# Patient Record
Sex: Male | Born: 1975 | Race: White | Hispanic: No | Smoking: Former smoker
Health system: Southern US, Community
[De-identification: ages and names within clinical notes are randomized; demographics above are authoritative.]

## PROBLEM LIST (undated history)

## (undated) DIAGNOSIS — K219 Gastro-esophageal reflux disease without esophagitis: Secondary | ICD-10-CM

## (undated) DIAGNOSIS — M1990 Unspecified osteoarthritis, unspecified site: Secondary | ICD-10-CM

## (undated) HISTORY — PX: KNEE SURGERY: SHX244

## (undated) HISTORY — PX: MYRINGOTOMY WITH TUBE PLACEMENT: SHX5663

---

## 2001-02-05 ENCOUNTER — Emergency Department (HOSPITAL_COMMUNITY): Admission: EM | Admit: 2001-02-05 | Discharge: 2001-02-05 | Payer: Self-pay | Admitting: Emergency Medicine

## 2003-01-19 ENCOUNTER — Emergency Department (HOSPITAL_COMMUNITY): Admission: EM | Admit: 2003-01-19 | Discharge: 2003-01-19 | Payer: Self-pay | Admitting: Internal Medicine

## 2003-01-19 ENCOUNTER — Encounter: Payer: Self-pay | Admitting: Internal Medicine

## 2010-12-31 ENCOUNTER — Ambulatory Visit: Payer: Self-pay | Admitting: Family Medicine

## 2011-01-01 ENCOUNTER — Ambulatory Visit: Payer: Self-pay | Admitting: Family Medicine

## 2014-07-21 ENCOUNTER — Other Ambulatory Visit (HOSPITAL_COMMUNITY): Payer: Self-pay | Admitting: Sports Medicine

## 2014-07-21 DIAGNOSIS — M25562 Pain in left knee: Secondary | ICD-10-CM

## 2014-07-29 ENCOUNTER — Ambulatory Visit (HOSPITAL_COMMUNITY)
Admission: RE | Admit: 2014-07-29 | Discharge: 2014-07-29 | Disposition: A | Discharge status: Home / Self Care | Payer: 59 | Source: Ambulatory Visit | Attending: Sports Medicine | Admitting: Sports Medicine

## 2014-07-29 DIAGNOSIS — R609 Edema, unspecified: Secondary | ICD-10-CM | POA: Diagnosis not present

## 2014-07-29 DIAGNOSIS — M2242 Chondromalacia patellae, left knee: Secondary | ICD-10-CM | POA: Insufficient documentation

## 2014-07-29 DIAGNOSIS — M25562 Pain in left knee: Secondary | ICD-10-CM | POA: Insufficient documentation

## 2014-08-02 ENCOUNTER — Ambulatory Visit: Payer: Self-pay | Admitting: Orthopedic Surgery

## 2014-11-21 ENCOUNTER — Encounter (HOSPITAL_COMMUNITY): Payer: Self-pay | Admitting: *Deleted

## 2014-11-21 ENCOUNTER — Emergency Department (HOSPITAL_COMMUNITY)
Admission: EM | Admit: 2014-11-21 | Discharge: 2014-11-21 | Disposition: A | Discharge status: Home / Self Care | Payer: 59 | Attending: Emergency Medicine | Admitting: Emergency Medicine

## 2014-11-21 ENCOUNTER — Emergency Department (HOSPITAL_COMMUNITY): Payer: 59

## 2014-11-21 DIAGNOSIS — Y9364 Activity, baseball: Secondary | ICD-10-CM | POA: Diagnosis not present

## 2014-11-21 DIAGNOSIS — X58XXXA Exposure to other specified factors, initial encounter: Secondary | ICD-10-CM | POA: Insufficient documentation

## 2014-11-21 DIAGNOSIS — S76312A Strain of muscle, fascia and tendon of the posterior muscle group at thigh level, left thigh, initial encounter: Secondary | ICD-10-CM | POA: Insufficient documentation

## 2014-11-21 DIAGNOSIS — Z87891 Personal history of nicotine dependence: Secondary | ICD-10-CM | POA: Insufficient documentation

## 2014-11-21 DIAGNOSIS — Y92838 Other recreation area as the place of occurrence of the external cause: Secondary | ICD-10-CM | POA: Insufficient documentation

## 2014-11-21 DIAGNOSIS — S76302A Unspecified injury of muscle, fascia and tendon of the posterior muscle group at thigh level, left thigh, initial encounter: Secondary | ICD-10-CM

## 2014-11-21 DIAGNOSIS — S8992XA Unspecified injury of left lower leg, initial encounter: Secondary | ICD-10-CM | POA: Diagnosis present

## 2014-11-21 DIAGNOSIS — Y998 Other external cause status: Secondary | ICD-10-CM | POA: Insufficient documentation

## 2014-11-21 MED ORDER — IBUPROFEN 800 MG PO TABS
800.0000 mg | ORAL_TABLET | Freq: Once | ORAL | Status: AC
  Administered 2014-11-21: 800 mg via ORAL
  Filled 2014-11-21: qty 1

## 2014-11-21 MED ORDER — OXYCODONE-ACETAMINOPHEN 5-325 MG PO TABS: 1.0000 | ORAL_TABLET | ORAL | Status: DC | PRN

## 2014-11-21 MED ORDER — NAPROXEN 500 MG PO TABS: 500.0000 mg | ORAL_TABLET | Freq: Two times a day (BID) | ORAL | Status: DC

## 2014-11-21 NOTE — ED Notes (Signed)
Given a pre pack of percocet, 1 q4 h prn

## 2014-11-21 NOTE — Discharge Instructions (Signed)
Tendon Injury Tendons are strong, cordlike structures that connect muscle to bone. Tendons are made up of woven fibers, like a rope. A tendon injury is a tear (rupture) of the tendon. The rupture may be partial (only a few of the fibers in your tendon rupture) or complete (your entire tendon ruptures). CAUSES  Tendon injuries can be caused by high-stress activities, such as sports. They also can be caused by a repetitive injury or by a single injury from an excessive, rapid force. SYMPTOMS  Symptoms of tendon injury include pain when you move the joint close to the tendon. Other symptoms are swelling, redness, and warmth. DIAGNOSIS  Tendon injuries often can be diagnosed by physical exam. However, sometimes an X-ray exam or advanced imaging, such as magnetic resonance imaging (MRI), is necessary to determine the extent of the injury. TREATMENT  Partial tendon ruptures often can be treated with immobilization. A splint, bandage, or removable brace usually is used to immobilize the injured tendon. Most injured tendons need to be immobilized for 1-2 months before they are completely healed. Complete tendon ruptures may require surgical reattachment. Document Released: 08/08/2004 Document Revised: 06/20/2011 Document Reviewed: 09/22/2011 [facility] Patient Information 2015 [place], [place]. This information is not intended to replace advice given to you by your health care provider. Make sure you discuss any questions you have with your health care provider.

## 2014-11-21 NOTE — ED Notes (Signed)
Pain lt knee, injury today when playing baseball, jumped up from crouching position and had pain in lt knee.

## 2014-11-21 NOTE — ED Notes (Signed)
Pt c/o left knee pain after catching a ball today at his son's baseball game; pt has had previous injury to knee

## 2014-11-23 NOTE — ED Provider Notes (Signed)
CSN: [id number]     Arrival date & time 11/21/14  2048 History   First MD Initiated Contact with Patient 11/21/14 2145     Chief Complaint  Patient presents with  . Knee Pain     (Consider location/radiation/quality/duration/timing/severity/associated sxs/prior Treatment) HPI   Luke Nunez. is a 39 y.o. male who presents to the Emergency Department complaining of left knee pain for several hours.  He states that he was playing baseball and jumped up from a squatting position.  He states that he felt a "tearing" sensation to the back of the left knee.  He now has pain with weight bearing and bending of the knee.  He denies pain to the lower leg or anterior knee, redness or swelling.  He has not taken any medications for his pain.   History reviewed. No pertinent past medical history. Past Surgical History  Procedure Laterality Date  . Knee surgery Left   . Myringotomy with tube placement Bilateral    History reviewed. No pertinent family history. History  Substance Use Topics  . Smoking status: Former Research scientist (life sciences)  . Smokeless tobacco: Not on file  . Alcohol Use: No    Review of Systems  Constitutional: Negative for fever and chills.  Musculoskeletal: Positive for arthralgias (left knee pain). Negative for joint swelling.  Skin: Negative for color change and wound.  All other systems reviewed and are negative.     Allergies  Septra  Home Medications   Prior to Admission medications   Medication Sig Start Date End Date Taking? Authorizing Provider  naproxen (NAPROSYN) 500 MG tablet Take 1 tablet (500 mg total) by mouth 2 (two) times daily with a meal. 11/21/14   Linley Moskal, PA-C  oxyCODONE-acetaminophen (PERCOCET/ROXICET) 5-325 MG per tablet Take 1 tablet by mouth every 4 (four) hours as needed. 11/21/14   Allante Whitmire, PA-C  oxyCODONE-acetaminophen (PERCOCET/ROXICET) 5-325 MG per tablet Take 1 tablet by mouth every 4 (four) hours as needed. 11/21/14   Renelda Kilian, PA-C    BP 133/89 mmHg  Pulse 95  Temp(Src) 98.3 F (36.8 C) (Oral)  Resp 20  Ht 6\' 1"  (1.854 m)  Wt 289 lb (131.09 kg)  BMI 38.14 kg/m2  SpO2 100% Physical Exam  Constitutional: He is oriented to person, place, and time. He appears well-developed and well-nourished. No distress.  Cardiovascular: Normal rate, regular rhythm, normal heart sounds and intact distal pulses.   Pulmonary/Chest: Effort normal and breath sounds normal. No respiratory distress.  Musculoskeletal: He exhibits tenderness.  ttp of the popliteal fossa of the left knee.  No erythema, effusion, or step-off deformity.  DP pulse brisk, distal sensation intact. Calf is soft and NT.  Thompson's test is negative.    Neurological: He is alert and oriented to person, place, and time. He exhibits normal muscle tone. Coordination normal.  Skin: Skin is warm and dry. No erythema.  Nursing note and vitals reviewed.   ED Course  Procedures (including critical care time) Labs Review Labs Reviewed - No data to display  Imaging Review Dg Knee Complete 4 Views Left  11/21/2014   CLINICAL DATA:  Left knee pain after catching a ball at his son's baseball game today  EXAM: LEFT KNEE - COMPLETE 4+ VIEW  COMPARISON:  None.  FINDINGS: There is no evidence of fracture, dislocation, or joint effusion. There is no evidence of arthropathy or other focal bone abnormality. Soft tissues are unremarkable.  IMPRESSION: Negative.   Electronically Signed   By: Shaune Pascal  Alroy Dust M.D.   On: 11/21/2014 21:38  '   EKG Interpretation None      MDM   Final diagnoses:  Hamstring injury, left, initial encounter    Likely ligamentous injury.  NV intact.    Knee immobilizer applied.  Pt agrees to ice, minimal wt bearing and close orthopedic f/u  Kem Parkinson, PA-C 11/23/14 2258  Davonna Belling, MD 11/24/14 [phone]

## 2014-11-28 ENCOUNTER — Ambulatory Visit (INDEPENDENT_AMBULATORY_CARE_PROVIDER_SITE_OTHER): Payer: 59 | Admitting: Orthopedic Surgery

## 2014-11-28 ENCOUNTER — Encounter: Payer: Self-pay | Admitting: Orthopedic Surgery

## 2014-11-28 VITALS — BP 125/81 | Ht 73.0 in | Wt 289.0 lb

## 2014-11-28 DIAGNOSIS — S76302A Unspecified injury of muscle, fascia and tendon of the posterior muscle group at thigh level, left thigh, initial encounter: Secondary | ICD-10-CM

## 2014-11-28 MED ORDER — IBUPROFEN 800 MG PO TABS: 800.0000 mg | ORAL_TABLET | Freq: Three times a day (TID) | ORAL | Status: DC | PRN

## 2014-11-28 NOTE — Addendum Note (Signed)
Addended by: Baldomero Lamy B on: 11/28/2014 04:48 PM   Modules accepted: Orders, Medications

## 2014-11-28 NOTE — Progress Notes (Signed)
Patient ID: Luke East., male   DOB: July 20, 1975, 39 y.o.   MRN: [id number]  Chief Complaint  Patient presents with  . Knee Pain    er follow up left knee injury, DOI 11/21/14     Luke East. is a 39 y.o. male.   HPI 39 year old male with a history of previous lateral release and recurrent knee effusion presents after catching at his son's T-ball game stood up felt a pop in the back of the medial aspect of his left knee and then couldn't weight-bear. X-rays were done at the hospital negative diagnosed with a hamstring injury and he comes in for evaluation complaining of posterior knee pain and also joint effusion. He's been able to weight-bear with crutches and a knee immobilizer. He did try walking without the brace and he was fine on flat ground but had trouble on uneven ground. Review of systems Review of Systems Negative  No past medical history on file.  Past Surgical History  Procedure Laterality Date  . Knee surgery Left   . Myringotomy with tube placement Bilateral     No family history on file.  Social History History  Substance Use Topics  . Smoking status: Former Research scientist (life sciences)  . Smokeless tobacco: Not on file  . Alcohol Use: No    Allergies  Allergen Reactions  . Septra [Sulfamethoxazole-Trimethoprim]     Current Outpatient Prescriptions  Medication Sig Dispense Refill  . naproxen (NAPROSYN) 500 MG tablet Take 1 tablet (500 mg total) by mouth 2 (two) times daily with a meal. 20 tablet 0  . oxyCODONE-acetaminophen (PERCOCET/ROXICET) 5-325 MG per tablet Take 1 tablet by mouth every 4 (four) hours as needed. 6 tablet 0  . ibuprofen (ADVIL,MOTRIN) 800 MG tablet Take 1 tablet (800 mg total) by mouth every 8 (eight) hours as needed. 90 tablet 0   No current facility-administered medications for this visit.       Physical Exam Blood pressure 125/81, height 6\' 1"  (1.854 m), weight 289 lb (131.09 kg). Physical Exam The patient is well developed well nourished  and well groomed. Orientation to person place and time is normal  Mood is pleasant. Ambulatory status walking with crutches weightbearing in a brace He has a left knee effusion he can bend his knee passively to 120 he can make it to full extension. He has medial hamstring pain near the knee joint. His knee feels stable his motor exam is normal skin is intact pulses are good sensation is normal  Data Reviewed Imaging negative  Assessment Encounter Diagnosis  Name Primary?  . Hamstring injury, left, initial encounter Yes   Also has knee effusion Plan Other than the knee effusion seems like a hamstring injury but we will reassess him in a couple weeks to be out of work a couple weeks

## 2014-11-28 NOTE — Patient Instructions (Signed)
Work note through the 31st  Weight bearing as tolerated with crutches

## 2014-11-29 MED FILL — Oxycodone w/ Acetaminophen Tab 5-325 MG: ORAL | Qty: 6 | Status: AC

## 2014-12-13 ENCOUNTER — Encounter: Payer: Self-pay | Admitting: Orthopedic Surgery

## 2014-12-13 ENCOUNTER — Ambulatory Visit (INDEPENDENT_AMBULATORY_CARE_PROVIDER_SITE_OTHER): Payer: 59 | Admitting: Orthopedic Surgery

## 2014-12-13 VITALS — BP 127/82 | Ht 73.0 in | Wt 289.0 lb

## 2014-12-13 DIAGNOSIS — S76302D Unspecified injury of muscle, fascia and tendon of the posterior muscle group at thigh level, left thigh, subsequent encounter: Secondary | ICD-10-CM

## 2014-12-13 NOTE — Patient Instructions (Signed)
Out of work another week.

## 2014-12-13 NOTE — Progress Notes (Signed)
Patient ID: Eligah East., male   DOB: 1975-11-15, 39 y.o.   MRN: [id number]  Chief Complaint  Patient presents with  . Follow-up    2 week recheck on left knee, hamstring, DOI 11-21-14.    BP 127/82 mmHg  Ht 6\' 1"  (1.854 m)  Wt 289 lb (131.09 kg)  BMI 38.14 kg/m2  Encounter Diagnosis  Name Primary?  . Hamstring injury, left, subsequent encounter Yes   Review of systems negative for numbness or tingling  History Waunita Schooner is doing better he is walking now without crutches he still has pain on the medial and lateral aspects of his knees especially if he's unilateral stairclimbing sequentially.   Physical exam Tenderness over the site of injury. 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  We will gone a week to week basis with his out of work status.  He will call me next Monday to see if he is ready to go back,  may take up to 6 weeks

## 2014-12-20 ENCOUNTER — Telehealth: Payer: Self-pay | Admitting: Orthopedic Surgery

## 2014-12-20 ENCOUNTER — Encounter: Payer: Self-pay | Admitting: Orthopedic Surgery

## 2014-12-20 NOTE — Telephone Encounter (Signed)
As chart notes indicate, patient called back to report that he is ready to return to work.  If Dr Aline Brochure agrees, he said he can go back tomorrow, or as Dr. Aline Brochure advises, and that updated note will be needed.  His ph# is [phone].

## 2014-12-20 NOTE — Telephone Encounter (Signed)
Ok   Follow the plan as stated

## 2014-12-20 NOTE — Telephone Encounter (Signed)
Notified patient he can pick up work note.

## 2015-01-12 ENCOUNTER — Ambulatory Visit (INDEPENDENT_AMBULATORY_CARE_PROVIDER_SITE_OTHER): Payer: 59 | Admitting: Orthopedic Surgery

## 2015-01-12 ENCOUNTER — Encounter: Payer: Self-pay | Admitting: Orthopedic Surgery

## 2015-01-12 DIAGNOSIS — S83512A Sprain of anterior cruciate ligament of left knee, initial encounter: Secondary | ICD-10-CM | POA: Diagnosis not present

## 2015-01-12 NOTE — Patient Instructions (Addendum)
We will schedule MRI and call you with appt and results  Joint Injection Care After Refer to this sheet in the next few days. These instructions provide you with information on caring for yourself after you have had a joint injection. Your caregiver also may give you more specific instructions. Your treatment has been planned according to current medical practices, but problems sometimes occur. Call your caregiver if you have any problems or questions after your procedure. After any type of joint injection, it is not uncommon to experience:  Soreness, swelling, or bruising around the injection site.  Mild numbness, tingling, or weakness around the injection site caused by the numbing medicine used before or with the injection. It also is possible to experience the following effects associated with the specific agent after injection:  Iodine-based contrast agents:  Allergic reaction (itching, hives, widespread redness, and swelling beyond the injection site).  Corticosteroids (These effects are rare.):  Allergic reaction.  Increased blood sugar levels (If you have diabetes and you notice that your blood sugar levels have increased, notify your caregiver).  Increased blood pressure levels.  Mood swings.  Hyaluronic acid in the use of viscosupplementation.  Temporary heat or redness.  Temporary rash and itching.  Increased fluid accumulation in the injected joint. These effects all should resolve within a day after your procedure.  HOME CARE INSTRUCTIONS  Limit yourself to light activity the day of your procedure. Avoid lifting heavy objects, bending, stooping, or twisting.  Take prescription or over-the-counter pain medication as directed by your caregiver.  You may apply ice to your injection site to reduce pain and swelling the day of your procedure. Ice may be applied 03-04 times:  Put ice in a plastic bag.  Place a towel between your skin and the bag.  Leave the ice on  for no longer than 15-20 minutes each time. SEEK IMMEDIATE MEDICAL CARE IF:   Pain and swelling get worse rather than better or extend beyond the injection site.  Numbness does not go away.  Blood or fluid continues to leak from the injection site.  You have chest pain.  You have swelling of your face or tongue.  You have trouble breathing or you become dizzy.  You develop a fever, chills, or severe tenderness at the injection site that last longer than 1 day. MAKE SURE YOU:  Understand these instructions.  Watch your condition.  Get help right away if you are not doing well or if you get worse. Document Released: 03/14/2011 Document Revised: 09/23/2011 Document Reviewed: 03/14/2011 [facility] Patient Information 2015 [place], [place]. This information is not intended to replace advice given to you by your health care provider. Make sure you discuss any questions you have with your health care provider.

## 2015-01-12 NOTE — Progress Notes (Signed)
Established patient progress note  39 year old male presents with pain and swelling left knee  Approximately one month ago the patient was at his son's ball game and stood up and felt a pop in his knee he couldn't weight-bear and he was treated for hamstring injury. He has a history of previous lateral release of the left knee after dislocation of the patella  He presents now with severe pain and swelling in the left knee which is described as a dull ache moderate in severity and present for the last week and a half. He has had no new trauma to the knee. His hamstring injury has healed nicely  Review of systems he has no fever, no erythema. No puncture wounds to the left knee.  History reviewed. No pertinent past medical history.  He is awake alert and oriented 3 walks in with a limp he has normal appearance grooming and hygiene his mood is pleasant his gait is marked by a limp has a large joint effusion flexion is limited to 100 versus full range of motion in the opposite knee is anterior cruciate ligament is questionable for laxity is collateral ligaments are stable has no patellofemoral pain his motor exam shows good straight leg raise his skin is intact without erythema has normal sensation in a good pulse  I aspirated the knee I got back 40 mL of yellow synovial fluid there was some cartilage pieces in it  We injected Depo-Medrol  Recommend MRI left knee rule out anterior cruciate ligament tear medial meniscal tear patella dislocation chondromalacia.  Procedure note injection and aspiration left knee joint  Verbal consent was obtained to aspirate and inject the left knee joint   Timeout was completed to confirm the site of aspiration and injection  An 18-gauge needle was used to aspirate the left knee joint from a suprapatellar lateral approach.  The medications used were 40 mg of Depo-Medrol and 1% lidocaine 3 cc  Anesthesia was provided by ethyl chloride and the skin was  prepped with alcohol.  After cleaning the skin with alcohol an 18-gauge needle was used to aspirate the right knee joint.  We obtained 40 cc of fluid  We follow this by injection of 40 mg of Depo-Medrol and 3 cc 1% lidocaine.  There were no complications. A sterile bandage was applied.

## 2015-01-26 ENCOUNTER — Ambulatory Visit (HOSPITAL_COMMUNITY)
Admission: RE | Admit: 2015-01-26 | Discharge: 2015-01-26 | Disposition: A | Discharge status: Home / Self Care | Payer: 59 | Source: Ambulatory Visit | Attending: Orthopedic Surgery | Admitting: Orthopedic Surgery

## 2015-01-26 DIAGNOSIS — M25562 Pain in left knee: Secondary | ICD-10-CM | POA: Diagnosis present

## 2015-01-26 DIAGNOSIS — M25462 Effusion, left knee: Secondary | ICD-10-CM | POA: Insufficient documentation

## 2015-01-26 DIAGNOSIS — X58XXXA Exposure to other specified factors, initial encounter: Secondary | ICD-10-CM | POA: Insufficient documentation

## 2015-01-26 DIAGNOSIS — M2242 Chondromalacia patellae, left knee: Secondary | ICD-10-CM | POA: Diagnosis not present

## 2015-01-26 DIAGNOSIS — S83242A Other tear of medial meniscus, current injury, left knee, initial encounter: Secondary | ICD-10-CM | POA: Diagnosis not present

## 2015-01-26 DIAGNOSIS — S83512A Sprain of anterior cruciate ligament of left knee, initial encounter: Secondary | ICD-10-CM

## 2015-01-30 ENCOUNTER — Encounter: Payer: Self-pay | Admitting: Orthopedic Surgery

## 2015-01-30 NOTE — Progress Notes (Signed)
IMPRESSION: 1. New large radial tear involving the root of the posterior horn of the medial meniscus. There is resulting meniscal extrusion from the joint. There is increased signal throughout the peripheral aspect of the posterior horn without definite extension to an articular surface. 2. 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 3. Mildly progressive superior patellar chondromalacia. 4. MCL degeneration with medial bursal fluid. This may contribute to medial pain.

## 2015-01-31 ENCOUNTER — Telehealth: Payer: Self-pay | Admitting: Orthopedic Surgery

## 2015-01-31 NOTE — Telephone Encounter (Signed)
Patient is calling asking for MRI results of knee done on 01/27/15, he understands those are done on Mon, Wed & Friday, he can be reached at [phone]

## 2015-02-01 ENCOUNTER — Other Ambulatory Visit: Payer: Self-pay | Admitting: *Deleted

## 2015-02-01 ENCOUNTER — Telehealth: Payer: Self-pay | Admitting: Orthopedic Surgery

## 2015-02-01 NOTE — Telephone Encounter (Signed)
Results given patient needs surgery patient will have arthroscopy of the knee August 3 we can get him on the schedule IMPRESSION: 1. New large radial tear involving the root of the posterior horn of the medial meniscus. There is resulting meniscal extrusion from the joint. There is increased signal throughout the peripheral aspect of the posterior horn without definite extension to an articular surface. 2. 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 3. Mildly progressive superior patellar chondromalacia. 4. MCL degeneration with medial bursal fluid. This may contribute to medial pain.     Electronically Signed   By: Richardean Sale M.D.   On: 01/27/2015 08:46

## 2015-02-07 ENCOUNTER — Other Ambulatory Visit: Payer: Self-pay | Admitting: *Deleted

## 2015-02-07 NOTE — Patient Instructions (Signed)
Luke Nunez.  02/07/2015     @PREFPERIOPPHARMACY @   Your procedure is scheduled on 02/15/2015  Report to [facility] at  845  A.M.  Call this number if you have problems the morning of surgery:  [phone]   Remember:  Do not eat food or drink liquids after midnight.  Take these medicines the morning of surgery with A SIP OF WATER:  zantac   Do not wear jewelry, make-up or nail polish.  Do not wear lotions, powders, or perfumes.    Do not shave 48 hours prior to surgery.  Men may shave face and neck.  Do not bring valuables to the hospital.  [facility] is not responsible for any belongings or valuables.  Contacts, dentures or bridgework may not be worn into surgery.  Leave your suitcase in the car.  After surgery it may be brought to your room.  For patients admitted to the hospital, discharge time will be determined by your treatment team.  Patients discharged the day of surgery will not be allowed to drive home.   Name and phone number of your driver:   family Special instructions:   none  Please read over the following fact sheets that you were given. Pain Booklet, Coughing and Deep Breathing, Surgical Site Infection Prevention, Anesthesia Post-op Instructions and Care and Recovery After Surgery      Arthroscopic Procedure, Knee An arthroscopic procedure can find what is wrong with your knee. PROCEDURE Arthroscopy is a surgical technique that allows your orthopedic surgeon to diagnose and treat your knee injury with accuracy. They will look into your knee through a small instrument. This is almost like a small (pencil sized) telescope. Because arthroscopy affects your knee less than open knee surgery, you can anticipate a more rapid recovery. Taking an active role by following your caregiver's instructions will help with rapid and complete recovery. Use crutches, rest, elevation, ice, and knee exercises as instructed. The length of recovery depends on various  factors including type of injury, age, physical condition, medical conditions, and your rehabilitation. Your knee is the joint between the large bones (femur and tibia) in your leg. Cartilage covers these bone ends which are smooth and slippery and allow your knee to bend and move smoothly. Two menisci, thick, semi-lunar shaped pads of cartilage which form a rim inside the joint, help absorb shock and stabilize your knee. Ligaments bind the bones together and support your knee joint. Muscles move the joint, help support your knee, and take stress off the joint itself. Because of this all programs and physical therapy to rehabilitate an injured or repaired knee require rebuilding and strengthening your muscles. AFTER THE PROCEDURE  After the procedure, you will be moved to a recovery area until most of the effects of the medication have worn off. Your caregiver will discuss the test results with you.  Only take over-the-counter or prescription medicines for pain, discomfort, or fever as directed by your caregiver. SEEK MEDICAL CARE IF:   You have increased bleeding from your wounds.  You see redness, swelling, or have increasing pain in your wounds.  You have pus coming from your wound.  You have an oral temperature above 102 F (38.9 C).  You notice a bad smell coming from the wound or dressing.  You have severe pain with any motion of your knee. SEEK IMMEDIATE MEDICAL CARE IF:   You develop a rash.  You have difficulty breathing.  You have  any allergic problems. Document Released: 06/28/2000 Document Revised: 09/23/2011 Document Reviewed: 01/20/2008 [facility] Patient Information 2015 [place], [place]. This information is not intended to replace advice given to you by your health care provider. Make sure you discuss any questions you have with your health care provider. PATIENT INSTRUCTIONS POST-ANESTHESIA  IMMEDIATELY FOLLOWING SURGERY:  Do not drive or operate machinery for the first  twenty four hours after surgery.  Do not make any important decisions for twenty four hours after surgery or while taking narcotic pain medications or sedatives.  If you develop intractable nausea and vomiting or a severe headache please notify your doctor immediately.  FOLLOW-UP:  Please make an appointment with your surgeon as instructed. You do not need to follow up with anesthesia unless specifically instructed to do so.  WOUND CARE INSTRUCTIONS (if applicable):  Keep a dry clean dressing on the anesthesia/puncture wound site if there is drainage.  Once the wound has quit draining you may leave it open to air.  Generally you should leave the bandage intact for twenty four hours unless there is drainage.  If the epidural site drains for more than 36-48 hours please call the anesthesia department.  QUESTIONS?:  Please feel free to call your physician or the hospital operator if you have any questions, and they will be happy to assist you.

## 2015-02-08 ENCOUNTER — Encounter (HOSPITAL_COMMUNITY): Payer: Self-pay

## 2015-02-08 ENCOUNTER — Encounter (HOSPITAL_COMMUNITY)
Admission: RE | Admit: 2015-02-08 | Discharge: 2015-02-08 | Disposition: A | Discharge status: Home / Self Care | Payer: 59 | Source: Ambulatory Visit | Attending: Orthopedic Surgery | Admitting: Orthopedic Surgery

## 2015-02-08 DIAGNOSIS — Z01818 Encounter for other preprocedural examination: Secondary | ICD-10-CM | POA: Diagnosis present

## 2015-02-08 HISTORY — DX: Unspecified osteoarthritis, unspecified site: M19.90

## 2015-02-08 HISTORY — DX: Gastro-esophageal reflux disease without esophagitis: K21.9

## 2015-02-08 LAB — CBC WITH DIFFERENTIAL/PLATELET
BASOS ABS: 0 10*3/uL (ref 0.0–0.1)
BASOS PCT: 0 % (ref 0–1)
Eosinophils Absolute: 0.1 10*3/uL (ref 0.0–0.7)
Eosinophils Relative: 2 % (ref 0–5)
HEMATOCRIT: 41.6 % (ref 39.0–52.0)
HEMOGLOBIN: 14.3 g/dL (ref 13.0–17.0)
LYMPHS PCT: 26 % (ref 12–46)
Lymphs Abs: 2.4 10*3/uL (ref 0.7–4.0)
MCH: 28.5 pg (ref 26.0–34.0)
MCHC: 34.4 g/dL (ref 30.0–36.0)
MCV: 82.9 fL (ref 78.0–100.0)
Monocytes Absolute: 0.7 10*3/uL (ref 0.1–1.0)
Monocytes Relative: 8 % (ref 3–12)
NEUTROS PCT: 64 % (ref 43–77)
Neutro Abs: 5.8 10*3/uL (ref 1.7–7.7)
PLATELETS: 250 10*3/uL (ref 150–400)
RBC: 5.02 MIL/uL (ref 4.22–5.81)
RDW: 13.3 % (ref 11.5–15.5)
WBC: 9 10*3/uL (ref 4.0–10.5)

## 2015-02-08 LAB — BASIC METABOLIC PANEL
Anion gap: 6 (ref 5–15)
BUN: 14 mg/dL (ref 6–20)
CHLORIDE: 106 mmol/L (ref 101–111)
CO2: 28 mmol/L (ref 22–32)
Calcium: 9.3 mg/dL (ref 8.9–10.3)
Creatinine, Ser: 1.19 mg/dL (ref 0.61–1.24)
GFR calc Af Amer: >60 mL/min (ref >60)
GFR calc non Af Amer: >60 mL/min (ref >60)
GLUCOSE: 92 mg/dL (ref 65–99)
Potassium: 4.8 mmol/L (ref 3.5–5.1)
SODIUM: 140 mmol/L (ref 135–145)

## 2015-02-08 NOTE — Progress Notes (Signed)
   02/08/15 1304  OBSTRUCTIVE SLEEP APNEA  Have you ever been diagnosed with sleep apnea through a sleep study? No  Do you snore loudly (loud enough to be heard through closed doors)?  1  Do you often feel tired, fatigued, or sleepy during the daytime? 1  Has anyone observed you stop breathing during your sleep? 1  Do you have, or are you being treated for high blood pressure? 0  BMI more than 35 kg/m2? 1  Age over 39 years old? 0  Neck circumference greater than 40 cm/16 inches? 1  Gender: 1

## 2015-02-08 NOTE — Pre-Procedure Instructions (Signed)
Patient given information to sign up for my chart at home. 

## 2015-02-14 NOTE — H&P (Signed)
Chief Complaint   Patient presents with   .  Knee Pain       er follow up left knee injury, DOI 11/21/14      Luke Nunez. is a 39 y.o. male.   HPI 39 year old male with a history of previous lateral release and recurrent knee effusion presents after catching at his son's T-ball game stood up felt a pop in the back of the medial aspect of his left knee and then couldn't weight-bear. X-rays were done at the hospital negative diagnosed with a hamstring injury and he comes in for evaluation complaining of posterior knee pain and also joint effusion. He's been able to weight-bear with crutches and a knee immobilizer. He did try walking without the brace and he was fine on flat ground but had trouble on uneven ground. Review of systems Review of Systems Negative  No past medical history on file.    Past Surgical History   Procedure  Laterality  Date   .  Knee surgery  Left     .  Myringotomy with tube placement  Bilateral       No family history on file.  Social History History   Substance Use Topics   .  Smoking status:  Former Research scientist (life sciences)   .  Smokeless tobacco:  Not on file   .  Alcohol Use:  No       Allergies   Allergen  Reactions   .  Septra [Sulfamethoxazole-Trimethoprim]            Physical Exam Blood pressure 125/81, height 6\' 1"  (1.854 m), weight 289 lb (131.09 kg). Physical Exam The patient is well developed well nourished and well groomed. Orientation to person place and time is normal   Mood is pleasant. Ambulatory status walking with crutches weightbearing in a brace He has a left knee effusion he can bend his knee passively to 120 he can make it to full extension. He has medial hamstring pain near the knee joint. His knee feels stable his motor exam is normal skin is intact pulses are good sensation is normal. The medial tenderness is associated with weakly positive McMurray's positive test.  His other extremities are normal on inspection. Contractures are  absent. Instability subluxation of the joints are normal. Motor exam shows normal muscle tone.  Distal neurologic exam and vascular exams are normal.  Data Reviewed Imaging negative  Assessment Encounter Diagnosis   MRI shows torn medial meniscus  Plan is for arthroscopy and partial medial meniscectomy. He understands that he has medial compartment degenerative disease which cannot be addressed arthroscopically there is also patellofemoral chondromalacia which may cause him to have continued symptoms after surgery  The patient however indicates that his knee is in such condition that he cannot continue in this way and would rather have surgery to try to improve the condition of his knee.                 IMPRESSION: of the MRI  1. New large radial tear involving the root of the posterior horn of the medial meniscus. There is resulting meniscal extrusion from the joint. There is increased signal throughout the peripheral aspect of the posterior horn without definite extension to an articular surface. 2. 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 3. Mildly progressive superior patellar chondromalacia. 4. MCL degeneration with medial bursal fluid. This may contribute to medial pain.     Electronically Signed   By: Gwyndolyn Saxon  Lin Landsman M.D.   On: 01/27/2015 08:46

## 2015-02-15 ENCOUNTER — Encounter (HOSPITAL_COMMUNITY): Payer: Self-pay

## 2015-02-15 ENCOUNTER — Ambulatory Visit (HOSPITAL_COMMUNITY): Payer: 59 | Admitting: Anesthesiology

## 2015-02-15 ENCOUNTER — Ambulatory Visit (HOSPITAL_COMMUNITY)
Admission: RE | Admit: 2015-02-15 | Discharge: 2015-02-15 | Disposition: A | Discharge status: Home / Self Care | Payer: 59 | Source: Ambulatory Visit | Attending: Orthopedic Surgery | Admitting: Orthopedic Surgery

## 2015-02-15 ENCOUNTER — Encounter (HOSPITAL_COMMUNITY)
Admission: RE | Disposition: A | Discharge status: Home / Self Care | Payer: Self-pay | Source: Ambulatory Visit | Attending: Orthopedic Surgery

## 2015-02-15 DIAGNOSIS — X58XXXA Exposure to other specified factors, initial encounter: Secondary | ICD-10-CM | POA: Insufficient documentation

## 2015-02-15 DIAGNOSIS — D480 Neoplasm of uncertain behavior of bone and articular cartilage: Secondary | ICD-10-CM | POA: Diagnosis not present

## 2015-02-15 DIAGNOSIS — K219 Gastro-esophageal reflux disease without esophagitis: Secondary | ICD-10-CM | POA: Insufficient documentation

## 2015-02-15 DIAGNOSIS — M659 Synovitis and tenosynovitis, unspecified: Secondary | ICD-10-CM | POA: Diagnosis not present

## 2015-02-15 DIAGNOSIS — S82092A Other fracture of left patella, initial encounter for closed fracture: Secondary | ICD-10-CM | POA: Insufficient documentation

## 2015-02-15 DIAGNOSIS — M2242 Chondromalacia patellae, left knee: Secondary | ICD-10-CM | POA: Diagnosis not present

## 2015-02-15 DIAGNOSIS — Z79899 Other long term (current) drug therapy: Secondary | ICD-10-CM | POA: Diagnosis not present

## 2015-02-15 DIAGNOSIS — M94269 Chondromalacia, unspecified knee: Secondary | ICD-10-CM | POA: Insufficient documentation

## 2015-02-15 DIAGNOSIS — M94262 Chondromalacia, left knee: Secondary | ICD-10-CM

## 2015-02-15 DIAGNOSIS — S83242A Other tear of medial meniscus, current injury, left knee, initial encounter: Secondary | ICD-10-CM | POA: Diagnosis present

## 2015-02-15 DIAGNOSIS — M25562 Pain in left knee: Secondary | ICD-10-CM

## 2015-02-15 DIAGNOSIS — Z87891 Personal history of nicotine dependence: Secondary | ICD-10-CM | POA: Insufficient documentation

## 2015-02-15 HISTORY — PX: KNEE ARTHROSCOPY: SHX127

## 2015-02-15 SURGERY — ARTHROSCOPY, KNEE
Anesthesia: General | Site: Knee | Laterality: Left

## 2015-02-15 MED ORDER — FENTANYL CITRATE (PF) 100 MCG/2ML IJ SOLN
25.0000 ug | INTRAMUSCULAR | Status: DC | PRN
  Administered 2015-02-15: 25 ug via INTRAVENOUS
  Filled 2015-02-15: qty 2

## 2015-02-15 MED ORDER — FENTANYL CITRATE (PF) 100 MCG/2ML IJ SOLN
INTRAMUSCULAR | Status: AC
  Filled 2015-02-15: qty 4

## 2015-02-15 MED ORDER — FENTANYL CITRATE (PF) 100 MCG/2ML IJ SOLN
INTRAMUSCULAR | Status: DC | PRN
  Administered 2015-02-15: 25 ug via INTRAVENOUS
  Administered 2015-02-15: 50 ug via INTRAVENOUS
  Administered 2015-02-15: 25 ug via INTRAVENOUS
  Administered 2015-02-15 (×2): 12.5 ug via INTRAVENOUS

## 2015-02-15 MED ORDER — IBUPROFEN 800 MG PO TABS: 800.0000 mg | ORAL_TABLET | Freq: Three times a day (TID) | ORAL | Status: DC

## 2015-02-15 MED ORDER — HYDROCODONE-ACETAMINOPHEN 5-325 MG PO TABS
2.0000 | ORAL_TABLET | Freq: Once | ORAL | Status: AC
  Administered 2015-02-15: 2 via ORAL

## 2015-02-15 MED ORDER — ONDANSETRON HCL 4 MG/2ML IJ SOLN
4.0000 mg | Freq: Once | INTRAMUSCULAR | Status: AC
  Administered 2015-02-15: 4 mg via INTRAVENOUS
  Filled 2015-02-15: qty 2

## 2015-02-15 MED ORDER — HYDROCODONE-ACETAMINOPHEN 10-325 MG PO TABS: 1.0000 | ORAL_TABLET | Freq: Four times a day (QID) | ORAL | Status: DC | PRN

## 2015-02-15 MED ORDER — LIDOCAINE HCL (CARDIAC) 10 MG/ML IV SOLN
INTRAVENOUS | Status: DC | PRN
  Administered 2015-02-15: 50 mg via INTRAVENOUS

## 2015-02-15 MED ORDER — BUPIVACAINE-EPINEPHRINE (PF) 0.5% -1:200000 IJ SOLN
INTRAMUSCULAR | Status: AC
  Filled 2015-02-15: qty 60

## 2015-02-15 MED ORDER — BUPIVACAINE-EPINEPHRINE (PF) 0.5% -1:200000 IJ SOLN
INTRAMUSCULAR | Status: DC | PRN
  Administered 2015-02-15: 60 mL

## 2015-02-15 MED ORDER — ONDANSETRON HCL 4 MG/2ML IJ SOLN
INTRAMUSCULAR | Status: AC
  Filled 2015-02-15: qty 2

## 2015-02-15 MED ORDER — LACTATED RINGERS IV SOLN
INTRAVENOUS | Status: DC
  Administered 2015-02-15: 09:00:00 via INTRAVENOUS

## 2015-02-15 MED ORDER — FENTANYL CITRATE (PF) 100 MCG/2ML IJ SOLN
25.0000 ug | INTRAMUSCULAR | Status: AC
  Administered 2015-02-15 (×2): 25 ug via INTRAVENOUS
  Filled 2015-02-15: qty 2

## 2015-02-15 MED ORDER — CEFAZOLIN SODIUM-DEXTROSE 2-3 GM-% IV SOLR
2.0000 g | INTRAVENOUS | Status: AC
  Administered 2015-02-15: 2 g via INTRAVENOUS
  Filled 2015-02-15: qty 50

## 2015-02-15 MED ORDER — EPINEPHRINE HCL 1 MG/ML IJ SOLN
INTRAMUSCULAR | Status: AC
  Filled 2015-02-15: qty 4

## 2015-02-15 MED ORDER — KETOROLAC TROMETHAMINE 30 MG/ML IJ SOLN
INTRAMUSCULAR | Status: AC
  Filled 2015-02-15: qty 1

## 2015-02-15 MED ORDER — ONDANSETRON HCL 4 MG/2ML IJ SOLN
4.0000 mg | Freq: Once | INTRAMUSCULAR | Status: AC
  Administered 2015-02-15: 4 mg via INTRAVENOUS

## 2015-02-15 MED ORDER — HYDROCODONE-ACETAMINOPHEN 5-325 MG PO TABS
ORAL_TABLET | ORAL | Status: AC
  Filled 2015-02-15: qty 1

## 2015-02-15 MED ORDER — BUPIVACAINE-EPINEPHRINE (PF) 0.5% -1:200000 IJ SOLN
INTRAMUSCULAR | Status: AC
  Filled 2015-02-15: qty 30

## 2015-02-15 MED ORDER — CEFAZOLIN SODIUM-DEXTROSE 2-3 GM-% IV SOLR: 2.0000 g | INTRAVENOUS | Status: DC

## 2015-02-15 MED ORDER — PROPOFOL 10 MG/ML IV BOLUS
INTRAVENOUS | Status: AC
  Filled 2015-02-15: qty 20

## 2015-02-15 MED ORDER — LIDOCAINE HCL (PF) 1 % IJ SOLN
INTRAMUSCULAR | Status: AC
  Filled 2015-02-15: qty 15

## 2015-02-15 MED ORDER — SODIUM CHLORIDE 0.9 % IR SOLN
Status: DC | PRN
  Administered 2015-02-15 (×4): 3000 mL

## 2015-02-15 MED ORDER — KETOROLAC TROMETHAMINE 30 MG/ML IJ SOLN
30.0000 mg | Freq: Once | INTRAMUSCULAR | Status: AC
  Administered 2015-02-15: 30 mg via INTRAVENOUS

## 2015-02-15 MED ORDER — ROCURONIUM BROMIDE 50 MG/5ML IV SOLN
INTRAVENOUS | Status: AC
  Filled 2015-02-15: qty 2

## 2015-02-15 MED ORDER — ONDANSETRON HCL 4 MG/2ML IJ SOLN: 4.0000 mg | Freq: Once | INTRAMUSCULAR | Status: DC | PRN

## 2015-02-15 MED ORDER — PROPOFOL 10 MG/ML IV BOLUS
INTRAVENOUS | Status: DC | PRN
  Administered 2015-02-15: 10 mg via INTRAVENOUS
  Administered 2015-02-15: 30 mg via INTRAVENOUS
  Administered 2015-02-15: 200 mg via INTRAVENOUS

## 2015-02-15 MED ORDER — MIDAZOLAM HCL 2 MG/2ML IJ SOLN
1.0000 mg | INTRAMUSCULAR | Status: DC | PRN
  Administered 2015-02-15: 2 mg via INTRAVENOUS
  Filled 2015-02-15: qty 2

## 2015-02-15 MED ORDER — PROMETHAZINE HCL 12.5 MG PO TABS: 12.5000 mg | ORAL_TABLET | Freq: Four times a day (QID) | ORAL | Status: DC | PRN

## 2015-02-15 MED ORDER — SODIUM CHLORIDE 0.9 % IR SOLN
Status: DC | PRN
  Administered 2015-02-15: 1000 mL

## 2015-02-15 MED ORDER — CHLORHEXIDINE GLUCONATE 4 % EX LIQD: 60.0000 mL | Freq: Once | CUTANEOUS | Status: DC

## 2015-02-15 SURGICAL SUPPLY — 54 items
ARTHROWAND PARAGON T2 (SURGICAL WAND) ×4
BAG HAMPER (MISCELLANEOUS) ×4 IMPLANT
BANDAGE ELASTIC 6 VELCRO NS (GAUZE/BANDAGES/DRESSINGS) ×4 IMPLANT
BLADE AGGRESSIVE PLUS 4.0 (BLADE) ×4 IMPLANT
CHLORAPREP W/TINT 26ML (MISCELLANEOUS) ×8 IMPLANT
CLOTH BEACON ORANGE TIMEOUT ST (SAFETY) ×4 IMPLANT
COOLER CRYO IC GRAV AND TUBE (ORTHOPEDIC SUPPLIES) ×4 IMPLANT
CUFF CRYO KNEE LG 20X31 COOLER (ORTHOPEDIC SUPPLIES) ×4 IMPLANT
CUFF CRYO KNEE18X23 MED (MISCELLANEOUS) IMPLANT
CUFF TOURNIQUET SINGLE 34IN LL (TOURNIQUET CUFF) IMPLANT
CUFF TOURNIQUET SINGLE 44IN (TOURNIQUET CUFF) ×4 IMPLANT
CUTTER ANGLED DBL BITE 4.5 (BURR) IMPLANT
DECANTER SPIKE VIAL GLASS SM (MISCELLANEOUS) ×8 IMPLANT
GAUZE SPONGE 4X4 12PLY STRL (GAUZE/BANDAGES/DRESSINGS) ×3 IMPLANT
GAUZE SPONGE 4X4 16PLY XRAY LF (GAUZE/BANDAGES/DRESSINGS) ×4 IMPLANT
GAUZE XEROFORM 5X9 LF (GAUZE/BANDAGES/DRESSINGS) ×4 IMPLANT
GLOVE BIOGEL PI IND STRL 7.0 (GLOVE) ×4 IMPLANT
GLOVE BIOGEL PI IND STRL 7.5 (GLOVE) ×2 IMPLANT
GLOVE BIOGEL PI INDICATOR 7.0 (GLOVE) ×4
GLOVE BIOGEL PI INDICATOR 7.5 (GLOVE) ×2
GLOVE ECLIPSE 6.5 STRL STRAW (GLOVE) ×4 IMPLANT
GLOVE SKINSENSE NS SZ8.0 LF (GLOVE) ×2
GLOVE SKINSENSE STRL SZ8.0 LF (GLOVE) ×2 IMPLANT
GLOVE SS N UNI LF 8.5 STRL (GLOVE) ×4 IMPLANT
GOWN STRL REUS W/ TWL LRG LVL3 (GOWN DISPOSABLE) ×2 IMPLANT
GOWN STRL REUS W/TWL LRG LVL3 (GOWN DISPOSABLE) ×2
GOWN STRL REUS W/TWL XL LVL3 (GOWN DISPOSABLE) ×4 IMPLANT
HLDR LEG FOAM (MISCELLANEOUS) ×2 IMPLANT
IV NS IRRIG 3000ML ARTHROMATIC (IV SOLUTION) ×16 IMPLANT
KIT BLADEGUARD II DBL (SET/KITS/TRAYS/PACK) ×4 IMPLANT
KIT ROOM TURNOVER AP CYSTO (KITS) ×4 IMPLANT
LEG HOLDER FOAM (MISCELLANEOUS) ×2
MANIFOLD NEPTUNE II (INSTRUMENTS) ×4 IMPLANT
MARKER SKIN DUAL TIP RULER LAB (MISCELLANEOUS) ×4 IMPLANT
NEEDLE HYPO 18GX1.5 BLUNT FILL (NEEDLE) ×4 IMPLANT
NEEDLE HYPO 21X1.5 SAFETY (NEEDLE) ×4 IMPLANT
NEEDLE SPNL 18GX3.5 QUINCKE PK (NEEDLE) ×4 IMPLANT
NS IRRIG 1000ML POUR BTL (IV SOLUTION) ×4 IMPLANT
PACK ARTHRO LIMB DRAPE STRL (MISCELLANEOUS) ×4 IMPLANT
PAD ABD 5X9 TENDERSORB (GAUZE/BANDAGES/DRESSINGS) ×4 IMPLANT
PAD ARMBOARD 7.5X6 YLW CONV (MISCELLANEOUS) ×4 IMPLANT
PADDING CAST COTTON 6X4 STRL (CAST SUPPLIES) IMPLANT
PADDING WEBRIL 6 STERILE (GAUZE/BANDAGES/DRESSINGS) ×4 IMPLANT
SET ARTHROSCOPY INST (INSTRUMENTS) ×4 IMPLANT
SET ARTHROSCOPY PUMP TUBE (IRRIGATION / IRRIGATOR) ×4 IMPLANT
SET BASIN LINEN APH (SET/KITS/TRAYS/PACK) ×4 IMPLANT
SPONGE GAUZE 4X4 12PLY (GAUZE/BANDAGES/DRESSINGS) ×4 IMPLANT
SUT ETHILON 3 0 FSL (SUTURE) IMPLANT
SYR 30ML LL (SYRINGE) ×4 IMPLANT
SYRINGE 10CC LL (SYRINGE) ×4 IMPLANT
WAND 50 DEG COVAC W/CORD (SURGICAL WAND) ×4 IMPLANT
WAND 90 DEG TURBOVAC W/CORD (SURGICAL WAND) IMPLANT
WAND ARTHRO PARAGON T2 (SURGICAL WAND) ×2 IMPLANT
YANKAUER SUCT BULB TIP 10FT TU (MISCELLANEOUS) ×12 IMPLANT

## 2015-02-15 NOTE — Anesthesia Postprocedure Evaluation (Signed)
  Anesthesia Post-op Note  Patient: Luke Nunez.  Procedure(s) Performed: Procedure(s): LEFT ARTHROSCOPY KNEE, CHONDROPLASTY, EXTENSIVE DEBRIDEMENT (Left)  Patient Location: PACU  Anesthesia Type:General  Level of Consciousness: awake and alert   Airway and Oxygen Therapy: Patient Spontanous Breathing  Post-op Pain: mild  Post-op Assessment: Post-op Vital signs reviewed, Patient's Cardiovascular Status Stable, Respiratory Function Stable and Patent Airway              Post-op Vital Signs: Reviewed and stable  Last Vitals:  Filed Vitals:   02/15/15 1200  BP: 139/83  Pulse: 85  Temp:   Resp: 20    Complications: No apparent anesthesia complications

## 2015-02-15 NOTE — Op Note (Signed)
02/15/2015  11:25 AM  PATIENT:  Luke Nunez.  39 y.o. 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 size grade 2 depth Anterior cruciate ligament intact PCL intact Lateral meniscus intact Small radial non-displaceable tear root medial meniscus Lateral femoral condyle prior chondral injury with good chondral coverage and healing, old injury  SURGEON:  Surgeon(s) and Role:    * Carole Civil, MD - Primary  PHYSICIAN ASSISTANT:   ASSISTANTS: none   ANESTHESIA:   general  EBL:  Total I/O In: 900 [I.V.:900] Out: 10 [Blood:10]  BLOOD ADMINISTERED:none  DRAINS: none   LOCAL MEDICATIONS USED:  MARCAINE     SPECIMEN:  No Specimen  DISPOSITION OF SPECIMEN:  N/A  COUNTS:  YES  TOURNIQUET:    DICTATION: .Dragon Dictation  PLAN OF CARE: Discharge to home after PACU  PATIENT DISPOSITION:  PACU - hemodynamically stable.   Delay start of Pharmacological VTE agent (>24hrs) due to surgical blood loss or risk of bleeding: not applicable  The procedure was done as follows The patient was identified as [place] in the preop area were used to identification markers. We confirm the left knee as surgical site and marked as such  We reviewed his chart imaging updated it and he was taken to surgery. He was given appropriate amount of Ancef based on his body mass index and weight of 133 kg  He has successful general anesthesia with an LMA and in the supine position his left leg was prepped  and draped sterilely with a pad placed under the right leg.  The left knee was placed in arthroscopic leg holder  After timeout was completed we made a standard lateral portal and placed the scope into the joint and did a diagnostic arthroscopy starting from the suprapatellar pouch entering the knee from medial back to lateral and back to the patella pouch area  We have noted the detailed findings above.  We began by first addressing the radial tear in the medial meniscus which was not displaceable however this was debrided to insure that no extension of the tear would occur.  We then used a Paragon wand to do a chondroplasty of the medial femoral condyle and we also used a motorized shaver. There were 2 areas of 50% depth with chondral flap noted in one of the areas. We debrided this until it was stable and contoured back to a smooth condyle  Our attention was next turned to the patella and we had a large flap area there as well as a chondral fissure and fracture. This was debrided with the shaver and then the Paragon wand was used to contour is smooth with medial and central ridge.  We then performed a synovial resection using a shaver we removed synovial tissue from the suprapatellar pouch and medial and lateral gutter. We then used the shaver to remove approximately 10 synovial chondromatosis lesions that were attached to the synovium  We coagulated any bleeding with the ArthroCare wand and then irrigated the knee and closed with 3-0 nylon sutures injecting 60 mL of Marcaine with epinephrine  Sterile dressing was applied  Cryo/Cuff was applied and activated  Patient was extubated and taken to recovery room in stable condition

## 2015-02-15 NOTE — Interval H&P Note (Signed)
History and Physical Interval Note:  02/15/2015 9:55 AM  Luke Nunez.  has presented today for surgery, with the diagnosis of left medial meniscus tear  The various methods of treatment have been discussed with the patient and family. After consideration of risks, benefits and other options for treatment, the patient has consented to  Procedure(s): LEFT KNEE ARTHROSCOPY WITH MEDIAL MENISECTOMY (Left) as a surgical intervention .  The patient's history has been reviewed, patient examined, no change in status, stable for surgery.  I have reviewed the patient's chart and labs.  Questions were answered to the patient's satisfaction.     Arther Abbott

## 2015-02-15 NOTE — Anesthesia Preprocedure Evaluation (Signed)
Anesthesia Evaluation  Patient identified by MRN, date of birth, ID band Patient awake    Reviewed: Allergy & Precautions, NPO status , Patient's Chart, lab work & pertinent test results  Airway Mallampati: II  TM Distance: >3 FB     Dental  (+) Teeth Intact, Dental Advisory Given   Pulmonary former smoker,  breath sounds clear to auscultation        Cardiovascular negative cardio ROS  Rhythm:Regular Rate:Normal     Neuro/Psych    GI/Hepatic GERD-  Medicated and Controlled,  Endo/Other    Renal/GU      Musculoskeletal   Abdominal   Peds  Hematology   Anesthesia Other Findings   Reproductive/Obstetrics                             Anesthesia Physical Anesthesia Plan  ASA: II  Anesthesia Plan: General   Post-op Pain Management:    Induction: Intravenous  Airway Management Planned: LMA  Additional Equipment:   Intra-op Plan:   Post-operative Plan: Extubation in OR  Informed Consent: I have reviewed the patients History and Physical, chart, labs and discussed the procedure including the risks, benefits and alternatives for the proposed anesthesia with the patient or authorized representative who has indicated his/her understanding and acceptance.     Plan Discussed with:   Anesthesia Plan Comments:         Anesthesia Quick Evaluation

## 2015-02-15 NOTE — Discharge Instructions (Signed)
What we found: 1 extensive synovitis = inflammation 2 sign no real chondromatosis= joint producing multiple loose pieces of cartilage 3 chondromalacia of the kneecap and the femur= arthritis of the knee 4 nondisplaced radial tear medial meniscus= small tear and cartilage  What we did: Removal of synovitis Removal of loose pieces of cartilage Recontouring of abnormal arthritic lesions of the knee/chondroplasty Debridement of radial tear of medial meniscus/removal of a portion of the meniscus less than 10%     PATIENT INSTRUCTIONS POST-ANESTHESIA  IMMEDIATELY FOLLOWING SURGERY:  Do not drive or operate machinery for the first twenty four hours after surgery.  Do not make any important decisions for twenty four hours after surgery or while taking narcotic pain medications or sedatives.  If you develop intractable nausea and vomiting or a severe headache please notify your doctor immediately.  FOLLOW-UP:  Please make an appointment with your surgeon as instructed. You do not need to follow up with anesthesia unless specifically instructed to do so.  WOUND CARE INSTRUCTIONS (if applicable):  Keep a dry clean dressing on the anesthesia/puncture wound site if there is drainage.  Once the wound has quit draining you may leave it open to air.  Generally you should leave the bandage intact for twenty four hours unless there is drainage.  If the epidural site drains for more than 36-48 hours please call the anesthesia department.  QUESTIONS?:  Please feel free to call your physician or the hospital operator if you have any questions, and they will be happy to assist you.

## 2015-02-15 NOTE — Transfer of Care (Signed)
Immediate Anesthesia Transfer of Care Note  Patient: Luke Nunez.  Procedure(s) Performed: Procedure(s): LEFT KNEE ARTHROSCOPY, CHONDROPLASTY, EXTENSIVE DEBRIDEMENT (Left)  Patient Location: PACU  Anesthesia Type:General  Level of Consciousness: awake and patient cooperative  Airway & Oxygen Therapy: Patient Spontanous Breathing and non-rebreather face mask  Post-op Assessment: Report given to RN, Post -op Vital signs reviewed and stable and Patient moving all extremities  Post vital signs: Reviewed and stable   Complications: No apparent anesthesia complications

## 2015-02-15 NOTE — Brief Op Note (Signed)
02/15/2015  11:25 AM  PATIENT:  Luke Nunez.  39 y.o. 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 size grade 2 depth Anterior cruciate ligament intact PCL intact Lateral meniscus intact Small radial non-displaceable tear root medial meniscus Lateral femoral condyle prior chondral injury with good chondral coverage and healing, old injury  SURGEON:  Surgeon(s) and Role:    * Carole Civil, MD - Primary  PHYSICIAN ASSISTANT:   ASSISTANTS: none   ANESTHESIA:   general  EBL:  Total I/O In: 900 [I.V.:900] Out: 10 [Blood:10]  BLOOD ADMINISTERED:none  DRAINS: none   LOCAL MEDICATIONS USED:  MARCAINE     SPECIMEN:  No Specimen  DISPOSITION OF SPECIMEN:  N/A  COUNTS:  YES  TOURNIQUET:    DICTATION: .Dragon Dictation  PLAN OF CARE: Discharge to home after PACU  PATIENT DISPOSITION:  PACU - hemodynamically stable.   Delay start of Pharmacological VTE agent (>24hrs) due to surgical blood loss or risk of bleeding: not applicable  The procedure was done as follows The patient was identified as Luke Nunez in the preop area were used to identification markers. We confirm the left knee as surgical site and marked as such  We reviewed his chart imaging updated it and he was taken to surgery. He was given appropriate amount of Ancef based on his body mass index and weight of 133 kg  He has successful general anesthesia with an LMA and in the supine position his left leg was prepped  and draped sterilely with a pad placed under the right leg.  The left knee was placed in arthroscopic leg holder  After timeout was completed we made a standard lateral portal and placed the scope into the joint and did a diagnostic arthroscopy starting from the suprapatellar pouch entering the knee from medial back to lateral and back to the patella pouch area  We have noted the detailed findings above.  We began by first addressing the radial tear in the medial meniscus which was not displaceable however this was debrided to insure that no extension of the tear would occur.  We then used a Paragon wand to do a chondroplasty of the medial femoral condyle and we also used a motorized shaver. There were 2 areas of 50% depth with chondral flap noted in one of the areas. We debrided this until it was stable and contoured back to a smooth condyle  Our attention was next turned to the patella and we had a large flap area there as well as a chondral fissure and fracture. This was debrided with the shaver and then the Paragon wand was used to contour is smooth with medial and central ridge.  We then performed a synovial resection using a shaver we removed synovial tissue from the suprapatellar pouch and medial and lateral gutter. We then used the shaver to remove approximately 10 synovial chondromatosis lesions that were attached to the synovium  We coagulated any bleeding with the ArthroCare wand and then irrigated the knee and closed with 3-0 nylon sutures injecting 60 mL of Marcaine with epinephrine  Sterile dressing was applied  Cryo/Cuff was applied and activated  Patient was extubated and taken to recovery room in stable condition

## 2015-02-15 NOTE — Anesthesia Procedure Notes (Signed)
Procedure Name: LMA Insertion Date/Time: 02/15/2015 10:22 AM Performed by: Vista Deck Pre-anesthesia Checklist: Patient identified, Patient being monitored, Emergency Drugs available, Timeout performed and Suction available Patient Re-evaluated:Patient Re-evaluated prior to inductionOxygen Delivery Method: Circle System Utilized Preoxygenation: Pre-oxygenation with 100% oxygen Intubation Type: IV induction Ventilation: Mask ventilation without difficulty LMA: LMA inserted LMA Size: 5.0 Number of attempts: 1 Placement Confirmation: positive ETCO2 and breath sounds checked- equal and bilateral Tube secured with: Tape

## 2015-02-16 ENCOUNTER — Telehealth: Payer: Self-pay | Admitting: Orthopedic Surgery

## 2015-02-16 ENCOUNTER — Encounter (HOSPITAL_COMMUNITY): Payer: Self-pay | Admitting: Orthopedic Surgery

## 2015-02-16 ENCOUNTER — Encounter: Payer: Self-pay | Admitting: Orthopedic Surgery

## 2015-02-16 NOTE — Telephone Encounter (Signed)
Regarding out-patient surgery, [facility], 02/15/15, CPT [phone] and [place] by phone and on-line portal 02/15/15.  Spoke with representative Evan p, No pre-authorization required. Ref# [id number]

## 2015-02-20 ENCOUNTER — Ambulatory Visit (INDEPENDENT_AMBULATORY_CARE_PROVIDER_SITE_OTHER): Payer: 59 | Admitting: Orthopedic Surgery

## 2015-02-20 VITALS — BP 150/89 | Ht 75.0 in | Wt 294.0 lb

## 2015-02-20 DIAGNOSIS — Z9889 Other specified postprocedural states: Secondary | ICD-10-CM

## 2015-02-20 MED ORDER — HYDROCODONE-ACETAMINOPHEN 7.5-325 MG PO TABS: 1.0000 | ORAL_TABLET | ORAL | Status: DC | PRN

## 2015-02-20 NOTE — Patient Instructions (Addendum)
CALL [facility] THERAPY DEPT TO SCHEDULE VISITS

## 2015-02-20 NOTE — Progress Notes (Signed)
Patient ID: Luke East., male   DOB: [birth year], 39 y.o.   MRN: [id number]   Patient ID: Luke East., male   DOB: Jul 28, 1975, 39 y.o.   MRN: [id number]  Chief Complaint  Patient presents with  . Follow-up    post op 1, SALK W/MM, DOS 02/15/15    HPI Luke Nunez. is a 39 y.o. male.  Status post arthroscopy left knee and a partial medial meniscectomy and debridement of multiple chondral lesions involving his patellofemoral area and he had multiple loose fragments of cartilage which appeared to be synovitis from synovial chondromatosis Allergies  Allergen Reactions  . Septra [Sulfamethoxazole-Trimethoprim] Other (See Comments)    Childhood reaction    Current Outpatient Prescriptions  Medication Sig Dispense Refill  . HYDROcodone-acetaminophen (NORCO) 10-325 MG per tablet Take 1 tablet by mouth every 6 (six) hours as needed. 30 tablet 0  . ibuprofen (ADVIL,MOTRIN) 800 MG tablet Take 1 tablet (800 mg total) by mouth 3 (three) times daily. 90 tablet 0  . ranitidine (ZANTAC) 150 MG tablet Take 150 mg by mouth daily.     No current facility-administered medications for this visit.      Physical Exam Physical Exam Blood pressure 150/89, height 6\' 3"  (1.905 m), weight 294 lb (133.358 kg).  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

## 2015-03-01 ENCOUNTER — Ambulatory Visit (HOSPITAL_COMMUNITY): Payer: 59 | Attending: Orthopedic Surgery | Admitting: Physical Therapy

## 2015-03-01 DIAGNOSIS — R29898 Other symptoms and signs involving the musculoskeletal system: Secondary | ICD-10-CM | POA: Insufficient documentation

## 2015-03-01 DIAGNOSIS — M25662 Stiffness of left knee, not elsewhere classified: Secondary | ICD-10-CM | POA: Insufficient documentation

## 2015-03-01 DIAGNOSIS — R6889 Other general symptoms and signs: Secondary | ICD-10-CM | POA: Insufficient documentation

## 2015-03-01 DIAGNOSIS — M25562 Pain in left knee: Secondary | ICD-10-CM | POA: Insufficient documentation

## 2015-03-01 NOTE — Patient Instructions (Signed)
Strengthening: Quadriceps Set   Tighten muscles on top of thighs by pushing knees down into surface. Hold _3-5___ seconds. Repeat _10___ times per set. Do ___1_ sets per session. Do _10___ sessions per day. Do at various degrees  http://orth.exer.us/602   Copyright  VHI. All rights reserved.  Self-Mobilization: Heel Slide (Supine)   Slide left heel toward buttocks until a gentle stretch is felt. Hold __5-10__ seconds. Relax. Repeat __10__ times per set. Do ___1_ sets per session. Do _3___ sessions per day.  http://[email or web address].us/710   Copyright  VHI. All rights reserved.  Self-Mobilization: Knee Flexion (Prone)  Using 21/2, 5  And then 71/2 # wt  Bring left heel toward buttocks as close as possible. Hold _1___ seconds. Relax. Repeat _10___ times per set. Do ___1_ sets per session. Do _2___ sessions per day.  http://orth.exer.us/596   Copyright  VHI. All rights reserved.  Strengthening: Hip Extension (Prone)   Tighten muscles on front of left thigh, then lift leg __2__ inches from surface, keeping knee locked. Repeat __10__ times per set. Do __1__ sets per session. Do __3__ sessions per day. Do with 5# wt  http://[email or web address].exer.us/620   Copyright  VHI. All rights reserved.

## 2015-03-01 NOTE — Patient Instructions (Signed)
Strengthening: Quadriceps Set   Tighten muscles on top of thighs by pushing knees down into surface. Hold _3-5___ seconds. Repeat _10___ times per set. Do ___1_ sets per session. Do _10___ sessions per day. Do at various degrees  http://orth.exer.us/602   Copyright  VHI. All rights reserved.  Self-Mobilization: Heel Slide (Supine)   Slide left heel toward buttocks until a gentle stretch is felt. Hold __5-10__ seconds. Relax. Repeat __10__ times per set. Do ___1_ sets per session. Do _3___ sessions per day.  http://[email or web address].us/710   Copyright  VHI. All rights reserved.  Self-Mobilization: Knee Flexion (Prone)  Using 21/2, 5  And then 71/2 # wt  Bring left heel toward buttocks as close as possible. Hold _1___ seconds. Relax. Repeat _10___ times per set. Do ___1_ sets per session. Do _2___ sessions per day.  http://orth.exer.us/596   Copyright  VHI. All rights reserved.  Strengthening: Hip Extension (Prone)   Tighten muscles on front of left thigh, then lift leg __2__ inches from surface, keeping knee locked. Repeat __10__ times per set. Do __1__ sets per session. Do __3__ sessions per day. Do with 5# wt  http://orth.exer.us/620   Copyright  VHI. All rights reserved.  Stretching: Hamstring (Supine)   Supporting right thigh behind knee, slowly straighten knee until stretch is felt in back of thigh. Hold ___30_ seconds. Repeat __3__ times per set. Do ___1_ sets per session. Do __3__ sessions per day.  http://orth.exer.us/656   Copyright  VHI. All rights reserved.

## 2015-03-01 NOTE — Therapy (Signed)
[facility] [address] [place], [place], [address] Phone: [phone]   Fax:  [phone]  Physical Therapy Evaluation  Patient Details  Name: Luke Nunez. MRN: [id number] Date of Birth: April 11, 1976 Referring Provider:  Carole Civil, MD  Encounter Date: 03/01/2015      PT End of Session - 03/01/15 1158    Visit Number 1   Number of Visits 9   Date for PT Re-Evaluation 03/22/15   Authorization Type UMR   PT Start Time 1020   PT Stop Time 1100   PT Time Calculation (min) 40 min   Activity Tolerance Patient tolerated treatment well   Behavior During Therapy [facility] for tasks assessed/performed      Past Medical History  Diagnosis Date  . GERD (gastroesophageal reflux disease)   . Arthritis     Past Surgical History  Procedure Laterality Date  . Knee surgery Left   . Myringotomy with tube placement Bilateral   . Knee arthroscopy Left 02/15/2015    Procedure: LEFT ARTHROSCOPY KNEE, CHONDROPLASTY, EXTENSIVE DEBRIDEMENT;  Surgeon: Carole Civil, MD;  Location: [facility];  Service: Orthopedics;  Laterality: Left;    There were no vitals filed for this visit.  Visit Diagnosis:  Stiffness of joint, lower leg, left  Knee pain, acute, left  Left leg weakness  Activity intolerance      Subjective Assessment - 03/01/15 1206    Subjective Mr. Raygoza states that he had arthroscopic surgery  on 02/15/2015.   He states that compared to prior to the surgery pain  he is 80% better; as far as activity his is unsure because he has been scared to use it.    How long can you sit comfortably? Able to sit for about 30 minutes.    How long can you stand comfortably? able to stand for 45 minutes before he needs to sit down.    How long can you walk comfortably? able to walk 30 minutes    Currently in Pain? Yes   Pain Score 3    Pain Location Knee   Pain Orientation Left;Anterior;Medial   Pain Descriptors / Indicators Throbbing   Pain Type Surgical pain   Pain Onset 1 to 4 weeks ago   Pain Frequency Intermittent   Aggravating Factors  activity   Pain Relieving Factors ice, ibuprofen, pain meds at night only             [facility] PT Assessment - 03/01/15 0001    Assessment   Medical Diagnosis Lt arthroscopic surgery with pain    Onset Date/Surgical Date 02/15/15   Next MD Visit 03/13/2015    Prior Therapy none   Precautions   Precautions None   Balance Screen   Has the patient fallen in the past 6 months No   Has the patient had a decrease in activity level because of a fear of falling?  Yes   Is the patient reluctant to leave their home because of a fear of falling?  No   Prior Function   Level of Independence Independent   Vocation Full time employment   Vocation Requirements bending, squatting, lifting    Leisure play with children, mountain biking    Cognition   Overall Cognitive Status Within Functional Limits for tasks assessed   Observation/Other Assessments   Focus on Therapeutic Outcomes (FOTO)  48    Functional Tests   Functional tests Single leg stance;Sit to Stand   Single Leg Stance  Comments LT 60 sec; RT 60    Sit to Stand   Comments 9 in 30 seconds    ROM / Strength   AROM / PROM / Strength AROM;Strength   AROM   AROM Assessment Site Hip;Knee;Ankle   Right/Left Hip Left   Right/Left Knee Left   Left Knee Extension 0   Left Knee Flexion 118  RT 135   Right/Left Ankle Left   Strength   Strength Assessment Site Hip;Knee;Ankle   Right/Left Hip Left   Left Hip Flexion 5/5   Left Hip Extension 4-/5   Left Hip ABduction 5/5   Right/Left Knee Left   Left Knee Flexion 4-/5   Left Knee Extension 4/5   Right/Left Ankle Left   Left Ankle Dorsiflexion 5/5                   [facility] Adult PT Treatment/Exercise - 03/01/15 0001    Exercises   Exercises Knee/Hip   Knee/Hip Exercises: Stretches   Active Hamstring Stretch Left;3 reps;30 seconds   Knee/Hip Exercises: Supine    Quad Sets 10 reps   Heel Slides Left;10 reps   Knee/Hip Exercises: Prone   Hamstring Curl 10 reps   Hamstring Curl Limitations 2 1/2/ 5 # x 10 each    Hip Extension 10 reps   Hip Extension Limitations 5#     manual to decrease swelling x 5'            PT Education - 03/01/15 1156    Education provided Yes   Education Details Hep for strengthening and stretching    Person(s) Educated Patient          PT Short Term Goals - 03/01/15 1207    PT SHORT TERM GOAL #1   Title I HEP   Time 1   Period Days   Status New   PT SHORT TERM GOAL #2   Title ROM to 130 degree to allow pt to squat down in comfort    Time 10   Period Days   Status New   PT SHORT TERM GOAL #3   Title Pt to be able to sit for an hour in comfort to be able to go out to eat and enjoy a meal without increased pain.    Time 10   Period Days   PT SHORT TERM GOAL #4   Title Pt to be able to stand for an hour to be working towards return to work activities.    Time 10   Period Days   Status New   PT SHORT TERM GOAL #5   Title Pain level to be no more than a 3/10 with dailly activities    Time 10   Period Days           PT Long Term Goals - 03/01/15 1210    PT LONG TERM GOAL #1   Title I in advance HEP   Time 3   Period Weeks   Status New   PT LONG TERM GOAL #2   Title Pt strength to be at least 4+/5 to allow pt to go into squatting postion and to return without use of UE    Time 3   Period Weeks   Status New   PT LONG TERM GOAL #3   Title Pt pain to be no greater than a 1/10 with daily activity to allow RTW   Time 3   Period Weeks   PT LONG TERM GOAL #4  Title Pt to be able to walk for an hour without discomfort for RTW   Time 3   Period Weeks   PT LONG TERM GOAL #5   Title Pt to be able to sit with comfort for two hours to be able to travel and watch a movie in comfort.    Time 3   Period Weeks               Plan - 03/01/15 1158    Clinical Impression Statement Mr. Allender  is a 39 yo male who has underwent a Lt knee arthroscopic surgery earlier this month.  At this time Mr. Calvey has not returned to work due to not phycially being able to complete his job requirements.  He works at [employer] as a Merchant navy officer and needs to be able to squat, lift and be on his knees on a regular basis; he is now being referred to skilled therapy to retrun him to his maximal functional ability.  Pt examination demonstrates decreased ROM, decreased strength and decreased balance; he will benefit from skilled PT to address these issues and maximize his functional ability including returning to work .     Pt will benefit from skilled therapeutic intervention in order to improve on the following deficits Abnormal gait;Decreased activity tolerance;Decreased balance;Decreased endurance;Decreased range of motion;Decreased strength;Pain;Difficulty walking   Rehab Potential Good   PT Frequency 3x / week   PT Duration 3 weeks   PT Treatment/Interventions Gait training;Stair training;Functional mobility training;Therapeutic activities;Therapeutic exercise;Balance training;Patient/family education;Manual lymph drainage   PT Next Visit Plan begin rockerboard, standing hamstring with 4# wt, standing and supine terminal extension, standing knee flexion stretch, heel raises and functional squats, manual to decrease swelling.    PT Home Exercise Plan given   Consulted and Agree with Plan of Care Patient         Problem List Patient Active Problem List   Diagnosis Date Noted  . Chondromalacia of knee   . Synovitis of knee     Rayetta Humphrey, PT CLT [phone] 03/01/2015, 12:16 PM  [facility] [address] [place], [place], [address] Phone: [phone]   Fax:  [phone]

## 2015-03-03 ENCOUNTER — Ambulatory Visit (HOSPITAL_COMMUNITY): Payer: 59

## 2015-03-03 DIAGNOSIS — M25562 Pain in left knee: Secondary | ICD-10-CM

## 2015-03-03 DIAGNOSIS — M25662 Stiffness of left knee, not elsewhere classified: Secondary | ICD-10-CM | POA: Diagnosis not present

## 2015-03-03 DIAGNOSIS — R29898 Other symptoms and signs involving the musculoskeletal system: Secondary | ICD-10-CM

## 2015-03-03 DIAGNOSIS — R6889 Other general symptoms and signs: Secondary | ICD-10-CM

## 2015-03-03 NOTE — Therapy (Signed)
[facility] [address] [place], [place], [address] Phone: [phone]   Fax:  [phone]  Physical Therapy Treatment  Patient Details  Name: Luke Nunez. MRN: [id number] Date of Birth: 1976-01-31 Referring Provider:  Carole Civil, MD  Encounter Date: 03/03/2015      PT End of Session - 03/03/15 1228    Visit Number 2   Number of Visits 9   Date for PT Re-Evaluation 03/22/15   Authorization Type UMR   PT Start Time 1155   PT Stop Time 1233   PT Time Calculation (min) 38 min   Activity Tolerance Patient tolerated treatment well   Behavior During Therapy [facility] for tasks assessed/performed      Past Medical History  Diagnosis Date  . GERD (gastroesophageal reflux disease)   . Arthritis     Past Surgical History  Procedure Laterality Date  . Knee surgery Left   . Myringotomy with tube placement Bilateral   . Knee arthroscopy Left 02/15/2015    Procedure: LEFT ARTHROSCOPY KNEE, CHONDROPLASTY, EXTENSIVE DEBRIDEMENT;  Surgeon: Carole Civil, MD;  Location: [facility];  Service: Orthopedics;  Laterality: Left;    There were no vitals filed for this visit.  Visit Diagnosis:  Stiffness of joint, lower leg, left  Knee pain, acute, left  Left leg weakness  Activity intolerance      Subjective Assessment - 03/03/15 1203    Subjective Pt stated he has sharp pain while walking on lateral aspect of knee.  Reports compliance with HEP   Currently in Pain? Yes   Pain Score 2    Pain Location Knee   Pain Orientation Left;Lateral   Pain Descriptors / Indicators [facility] Adult PT Treatment/Exercise - 03/03/15 0001    Exercises   Exercises Knee/Hip   Knee/Hip Exercises: Stretches   Active Hamstring Stretch Left;3 reps;30 seconds   Active Hamstring Stretch Limitations 12 in box   Knee: Self-Stretch to increase Flexion Left   Knee: Self-Stretch Limitations knee drives 83M on 62HU step   Gastroc  Stretch 3 reps;30 seconds   Gastroc Stretch Limitations slant board   Knee/Hip Exercises: Standing   Heel Raises Both;15 reps   Knee Flexion Left;10 reps   Knee Flexion Limitations 4#   Terminal Knee Extension Left;10 reps;Theraband   Theraband Level (Terminal Knee Extension) Level 2 (Red)   Functional Squat 10 reps   Functional Squat Limitations cueing for equal weight distribution   Rocker Board 2 minutes   Rocker Board Limitations R/L and A/P   Knee/Hip Exercises: Supine   Heel Slides Left;10 reps   Terminal Knee Extension Left;15 reps                  PT Short Term Goals - 03/01/15 1207    PT SHORT TERM GOAL #1   Title I HEP   Time 1   Period Days   Status New   PT SHORT TERM GOAL #2   Title ROM to 130 degree to allow pt to squat down in comfort    Time 10   Period Days   Status New   PT SHORT TERM GOAL #3   Title Pt to be able to sit for an hour in comfort to be able to go out to eat and enjoy a meal without increased pain.    Time 10   Period Days   PT SHORT  TERM GOAL #4   Title Pt to be able to stand for an hour to be working towards return to work activities.    Time 10   Period Days   Status New   PT SHORT TERM GOAL #5   Title Pain level to be no more than a 3/10 with dailly activities    Time 10   Period Days           PT Long Term Goals - 03/01/15 1210    PT LONG TERM GOAL #1   Title I in advance HEP   Time 3   Period Weeks   Status New   PT LONG TERM GOAL #2   Title Pt strength to be at least 4+/5 to allow pt to go into squatting postion and to return without use of UE    Time 3   Period Weeks   Status New   PT LONG TERM GOAL #3   Title Pt pain to be no greater than a 1/10 with daily activity to allow RTW   Time 3   Period Weeks   PT LONG TERM GOAL #4   Title Pt to be able to walk for an hour without discomfort for RTW   Time 3   Period Weeks   PT LONG TERM GOAL #5   Title Pt to be able to sit with comfort for two hours to be  able to travel and watch a movie in comfort.    Time 3   Period Weeks               Plan - 03/03/15 1230    Clinical Impression Statement Reviewed goals, compliance with HEP and copy of eval given to pt.  Session focus on improving weight distribution with gait and functional strengthening.  Pt able to demonstreate approraite technique following demonstration and cueing for weight loading with exercises.  Pt stated pain free at end of session.   PT Next Visit Plan Continue with current PT POC, begin lateral step ups next session.  Progress as able.          Problem List Patient Active Problem List   Diagnosis Date Noted  . Chondromalacia of knee   . Synovitis of knee    Ihor Austin, [place]; [place]  Aldona Lento 03/03/2015, 12:45 PM  [place], [place], [address] Phone: [phone]   Fax:  [phone]

## 2015-03-06 ENCOUNTER — Ambulatory Visit (HOSPITAL_COMMUNITY): Payer: 59 | Admitting: Physical Therapy

## 2015-03-06 DIAGNOSIS — M25562 Pain in left knee: Secondary | ICD-10-CM

## 2015-03-06 DIAGNOSIS — R29898 Other symptoms and signs involving the musculoskeletal system: Secondary | ICD-10-CM

## 2015-03-06 DIAGNOSIS — M25662 Stiffness of left knee, not elsewhere classified: Secondary | ICD-10-CM | POA: Diagnosis not present

## 2015-03-06 DIAGNOSIS — R6889 Other general symptoms and signs: Secondary | ICD-10-CM

## 2015-03-06 NOTE — Therapy (Signed)
[facility] [address] [place], [place], [address] Phone: [phone]   Fax:  [phone]  Physical Therapy Treatment  Patient Details  Name: Kaylib Furness. MRN: [id number] Date of Birth: [birth year] Referring Provider:  Carole Civil, MD  Encounter Date: 03/06/2015      PT End of Session - 03/06/15 1751    Visit Number 3   Number of Visits 9   Date for PT Re-Evaluation 03/22/15   Authorization Type UMR   PT Start Time 1600   PT Stop Time 1645   PT Time Calculation (min) 45 min   Activity Tolerance Patient tolerated treatment well   Behavior During Therapy [facility] for tasks assessed/performed      Past Medical History  Diagnosis Date  . GERD (gastroesophageal reflux disease)   . Arthritis     Past Surgical History  Procedure Laterality Date  . Knee surgery Left   . Myringotomy with tube placement Bilateral   . Knee arthroscopy Left 02/15/2015    Procedure: LEFT ARTHROSCOPY KNEE, CHONDROPLASTY, EXTENSIVE DEBRIDEMENT;  Surgeon: Carole Civil, MD;  Location: [facility];  Service: Orthopedics;  Laterality: Left;    There were no vitals filed for this visit.  Visit Diagnosis:  Stiffness of joint, lower leg, left  Knee pain, acute, left  Left leg weakness  Activity intolerance      Subjective Assessment - 03/06/15 1754    Subjective PT states he is still hurting.  States he feels tight and has swelling around his knee.  Currently 3/10 level reported.    Currently in Pain? Yes   Pain Score 3    Pain Location Knee   Pain Orientation Left;Lateral                         [facility] Adult PT Treatment/Exercise - 03/06/15 1701    Knee/Hip Exercises: Stretches   Active Hamstring Stretch Left;3 reps;30 seconds   Active Hamstring Stretch Limitations 12 in box   Gastroc Stretch 3 reps;30 seconds   Gastroc Stretch Limitations slant board   Knee/Hip Exercises: Standing   Heel Raises Both;15 reps   Heel  Raises Limitations toeraises 10 reps   Knee Flexion Left;10 reps   Knee Flexion Limitations 4#   Functional Squat 10 reps   Functional Squat Limitations cueing for equal weight distribution   Knee/Hip Exercises: Supine   Heel Slides Left;10 reps   Knee/Hip Exercises: Prone   Hamstring Curl 10 reps   Hamstring Curl Limitations 2 1/2/ 5 # x 10 each    Hip Extension 10 reps   Hip Extension Limitations 5#   Manual Therapy   Manual Therapy Edema management   Manual therapy comments retro massage with elevation.   Edema Management Lt knee                  PT Short Term Goals - 03/01/15 1207    PT SHORT TERM GOAL #1   Title I HEP   Time 1   Period Days   Status New   PT SHORT TERM GOAL #2   Title ROM to 130 degree to allow pt to squat down in comfort    Time 10   Period Days   Status New   PT SHORT TERM GOAL #3   Title Pt to be able to sit for an hour in comfort to be able to go out to eat and enjoy a meal without  increased pain.    Time 10   Period Days   PT SHORT TERM GOAL #4   Title Pt to be able to stand for an hour to be working towards return to work activities.    Time 10   Period Days   Status New   PT SHORT TERM GOAL #5   Title Pain level to be no more than a 3/10 with dailly activities    Time 10   Period Days           PT Long Term Goals - 03/01/15 1210    PT LONG TERM GOAL #1   Title I in advance HEP   Time 3   Period Weeks   Status New   PT LONG TERM GOAL #2   Title Pt strength to be at least 4+/5 to allow pt to go into squatting postion and to return without use of UE    Time 3   Period Weeks   Status New   PT LONG TERM GOAL #3   Title Pt pain to be no greater than a 1/10 with daily activity to allow RTW   Time 3   Period Weeks   PT LONG TERM GOAL #4   Title Pt to be able to walk for an hour without discomfort for RTW   Time 3   Period Weeks   PT LONG TERM GOAL #5   Title Pt to be able to sit with comfort for two hours to be able  to travel and watch a movie in comfort.    Time 3   Period Weeks               Plan - 03/06/15 1751    Clinical Impression Statement Continued with focus on improving ROM and strength.  Noted improvement in gait quality today.  Began step ups and forward lunges on riser.  Also added retro massage to decrease inflammation at end of session.  encouraged continuation of HEP.    PT Next Visit Plan Continue with current PT POC.  Progress strength and stability exericses.         Problem List Patient Active Problem List   Diagnosis Date Noted  . Chondromalacia of knee   . Synovitis of knee     Teena Irani, PTA/CLT [phone]  03/06/2015, 5:56 PM  [facility] [address] [place], [place], [address] Phone: [phone]   Fax:  [phone]

## 2015-03-08 ENCOUNTER — Ambulatory Visit (HOSPITAL_COMMUNITY): Payer: 59

## 2015-03-10 ENCOUNTER — Ambulatory Visit (HOSPITAL_COMMUNITY): Payer: 59

## 2015-03-10 ENCOUNTER — Telehealth (HOSPITAL_COMMUNITY): Payer: Self-pay

## 2015-03-10 DIAGNOSIS — R6889 Other general symptoms and signs: Secondary | ICD-10-CM

## 2015-03-10 DIAGNOSIS — M25662 Stiffness of left knee, not elsewhere classified: Secondary | ICD-10-CM | POA: Diagnosis not present

## 2015-03-10 DIAGNOSIS — R29898 Other symptoms and signs involving the musculoskeletal system: Secondary | ICD-10-CM

## 2015-03-10 DIAGNOSIS — M25562 Pain in left knee: Secondary | ICD-10-CM

## 2015-03-10 NOTE — Therapy (Signed)
[facility] [address] [place], [place], [address] Phone: [phone]   Fax:  [phone]  Physical Therapy Treatment  Patient Details  Name: Luke Nunez. MRN: [id number] Date of Birth: 1976/02/12 Referring Provider:  Carole Civil, MD  Encounter Date: 03/10/2015      PT End of Session - 03/10/15 1645    Visit Number 4   Number of Visits 9   Date for PT Re-Evaluation 03/22/15   Authorization Type UMR   PT Start Time 1600   PT Stop Time 1650   PT Time Calculation (min) 50 min   Activity Tolerance Patient tolerated treatment well   Behavior During Therapy [facility] for tasks assessed/performed      Past Medical History  Diagnosis Date  . GERD (gastroesophageal reflux disease)   . Arthritis     Past Surgical History  Procedure Laterality Date  . Knee surgery Left   . Myringotomy with tube placement Bilateral   . Knee arthroscopy Left 02/15/2015    Procedure: LEFT ARTHROSCOPY KNEE, CHONDROPLASTY, EXTENSIVE DEBRIDEMENT;  Surgeon: Carole Civil, MD;  Location: [facility];  Service: Orthopedics;  Laterality: Left;    There were no vitals filed for this visit.  Visit Diagnosis:  Stiffness of joint, lower leg, left  Knee pain, acute, left  Left leg weakness  Activity intolerance      Subjective Assessment - 03/10/15 1605    Subjective Pt states pain varies in Lt knee but feels more tightness than pain.  Pt reports he is walking 2-5 miles a day and compliant with HEP.    Currently in Pain? No/denies                         [facility] Adult PT Treatment/Exercise - 03/10/15 1604    Knee/Hip Exercises: Stretches   Active Hamstring Stretch Left;3 reps;30 seconds   Active Hamstring Stretch Limitations 12 in box   Gastroc Stretch 3 reps;30 seconds   Gastroc Stretch Limitations slant board   Knee/Hip Exercises: Standing   Heel Raises Both;15 reps   Heel Raises Limitations toeraises 15 reps   Knee Flexion  Left;15 reps   Knee Flexion Limitations 4#   Forward Lunges Both;10 reps   Forward Lunges Limitations 4"   Side Lunges Both;10 reps   Side Lunges Limitations 4"   Lateral Step Up Both;10 reps;Step Height: 4";Hand Hold: 0   Lateral Step Up Limitations 4"   Forward Step Up Both;10 reps   Forward Step Up Limitations 4"   Functional Squat 15 reps   Functional Squat Limitations cueing for equal weight distribution   Manual Therapy   Manual Therapy Edema management   Manual therapy comments retro massage with elevation.   Edema Management Lt knee                PT Education - 03/10/15 1653    Education provided Yes   Education Details information on where to purchase LE elevating wedge and deep prep cream for LE.    Person(s) Educated Patient   Methods Handout   Comprehension Verbalized understanding          PT Short Term Goals - 03/01/15 1207    PT SHORT TERM GOAL #1   Title I HEP   Time 1   Period Days   Status New   PT SHORT TERM GOAL #2   Title ROM to 130 degree to allow pt to squat down  in comfort    Time 10   Period Days   Status New   PT SHORT TERM GOAL #3   Title Pt to be able to sit for an hour in comfort to be able to go out to eat and enjoy a meal without increased pain.    Time 10   Period Days   PT SHORT TERM GOAL #4   Title Pt to be able to stand for an hour to be working towards return to work activities.    Time 10   Period Days   Status New   PT SHORT TERM GOAL #5   Title Pain level to be no more than a 3/10 with dailly activities    Time 10   Period Days           PT Long Term Goals - 03/01/15 1210    PT LONG TERM GOAL #1   Title I in advance HEP   Time 3   Period Weeks   Status New   PT LONG TERM GOAL #2   Title Pt strength to be at least 4+/5 to allow pt to go into squatting postion and to return without use of UE    Time 3   Period Weeks   Status New   PT LONG TERM GOAL #3   Title Pt pain to be no greater than a 1/10 with  daily activity to allow RTW   Time 3   Period Weeks   PT LONG TERM GOAL #4   Title Pt to be able to walk for an hour without discomfort for RTW   Time 3   Period Weeks   PT LONG TERM GOAL #5   Title Pt to be able to sit with comfort for two hours to be able to travel and watch a movie in comfort.    Time 3   Period Weeks               Plan - 03/10/15 1647    Clinical Impression Statement Continued to progress functional strength.  Added forward and lateral lunges and continued with step ups and stretches.  Pt able to complete all actvities without UE assist.  Swelling remains, expecially superior knee.  Completed manual with relief reported.  Encouraged patient to elevate and ice more often and given information on LE elevating pillow and deep prep cream for completing manual on knee.    PT Next Visit Plan Continue with current PT POC.  Progress strength and stability exericses.         Problem List Patient Active Problem List   Diagnosis Date Noted  . Chondromalacia of knee   . Synovitis of knee     Teena Irani, PTA/CLT [phone]  03/10/2015, 4:55 PM  [facility] [address] [place], [place], [address] Phone: [phone]   Fax:  [phone]

## 2015-03-13 ENCOUNTER — Ambulatory Visit (INDEPENDENT_AMBULATORY_CARE_PROVIDER_SITE_OTHER): Payer: 59 | Admitting: Orthopedic Surgery

## 2015-03-13 ENCOUNTER — Encounter: Payer: Self-pay | Admitting: Orthopedic Surgery

## 2015-03-13 VITALS — BP 123/85 | Ht 75.0 in | Wt 294.0 lb

## 2015-03-13 DIAGNOSIS — Z9889 Other specified postprocedural states: Secondary | ICD-10-CM

## 2015-03-13 DIAGNOSIS — Z4789 Encounter for other orthopedic aftercare: Secondary | ICD-10-CM

## 2015-03-13 DIAGNOSIS — M25462 Effusion, left knee: Secondary | ICD-10-CM

## 2015-03-13 NOTE — Progress Notes (Signed)
The patient is postop from a left knee arthroscopy on 02/15/2015. He's been walking 5 miles day completed physical therapy. He notices tightness in his knee when he bends it. He says he can't do a squat because the knee is tight  Mild pain pain has improved since before surgery  Knee has large effusion he indeed exhibits limited flexion when he tries to bend his knee all the way back  We aspirated the knee 60 mL of clear yellow fluid. We injected the knee with Depo-Medrol  Patient's nurse noted extended to September 14 all see him on the 13th continue ibuprofen decreased mileage walk from 5 miles to 3 miles  Procedure note injection and aspiration left knee joint  Verbal consent was obtained to aspirate and inject the left knee joint   Timeout was completed to confirm the site of aspiration and injection  An 18-gauge needle was used to aspirate the left knee joint from a suprapatellar lateral approach.  The medications used were 40 mg of Depo-Medrol and 1% lidocaine 3 cc  Anesthesia was provided by ethyl chloride and the skin was prepped with alcohol.  After cleaning the skin with alcohol an 18-gauge needle was used to aspirate the right knee joint.  We obtained 60 cc of fluid  We follow this by injection of 40 mg of Depo-Medrol and 3 cc 1% lidocaine.  There were no complications. A sterile bandage was applied.

## 2015-03-13 NOTE — Patient Instructions (Signed)
Out of work until 03/29/15  Joint Injection Care After Refer to this sheet in the next few days. These instructions provide you with information on caring for yourself after you have had a joint injection. Your caregiver also may give you more specific instructions. Your treatment has been planned according to current medical practices, but problems sometimes occur. Call your caregiver if you have any problems or questions after your procedure. After any type of joint injection, it is not uncommon to experience:  Soreness, swelling, or bruising around the injection site.  Mild numbness, tingling, or weakness around the injection site caused by the numbing medicine used before or with the injection. It also is possible to experience the following effects associated with the specific agent after injection:  Iodine-based contrast agents:  Allergic reaction (itching, hives, widespread redness, and swelling beyond the injection site).  Corticosteroids (These effects are rare.):  Allergic reaction.  Increased blood sugar levels (If you have diabetes and you notice that your blood sugar levels have increased, notify your caregiver).  Increased blood pressure levels.  Mood swings.  Hyaluronic acid in the use of viscosupplementation.  Temporary heat or redness.  Temporary rash and itching.  Increased fluid accumulation in the injected joint. These effects all should resolve within a day after your procedure.  HOME CARE INSTRUCTIONS  Limit yourself to light activity the day of your procedure. Avoid lifting heavy objects, bending, stooping, or twisting.  Take prescription or over-the-counter pain medication as directed by your caregiver.  You may apply ice to your injection site to reduce pain and swelling the day of your procedure. Ice may be applied 03-04 times:  Put ice in a plastic bag.  Place a towel between your skin and the bag.  Leave the ice on for no longer than 15-20  minutes each time. SEEK IMMEDIATE MEDICAL CARE IF:   Pain and swelling get worse rather than better or extend beyond the injection site.  Numbness does not go away.  Blood or fluid continues to leak from the injection site.  You have chest pain.  You have swelling of your face or tongue.  You have trouble breathing or you become dizzy.  You develop a fever, chills, or severe tenderness at the injection site that last longer than 1 day. MAKE SURE YOU:  Understand these instructions.  Watch your condition.  Get help right away if you are not doing well or if you get worse. Document Released: 03/14/2011 Document Revised: 09/23/2011 Document Reviewed: 03/14/2011 [facility] Patient Information 2015 [place], [place]. This information is not intended to replace advice given to you by your health care provider. Make sure you discuss any questions you have with your health care provider.

## 2015-03-15 ENCOUNTER — Ambulatory Visit (HOSPITAL_COMMUNITY): Payer: 59 | Admitting: Physical Therapy

## 2015-03-15 DIAGNOSIS — R29898 Other symptoms and signs involving the musculoskeletal system: Secondary | ICD-10-CM

## 2015-03-15 DIAGNOSIS — R6889 Other general symptoms and signs: Secondary | ICD-10-CM

## 2015-03-15 DIAGNOSIS — M25662 Stiffness of left knee, not elsewhere classified: Secondary | ICD-10-CM | POA: Diagnosis not present

## 2015-03-15 DIAGNOSIS — M25562 Pain in left knee: Secondary | ICD-10-CM

## 2015-03-15 NOTE — Therapy (Signed)
[facility] [address] [place], [place], [address] Phone: [phone]   Fax:  [phone]  Physical Therapy Treatment  Patient Details  Name: Luke Nunez. MRN: [id number] Date of Birth: [birth year] Referring Provider:  Carole Civil, MD  Encounter Date: 03/15/2015      PT End of Session - 03/15/15 1113    Visit Number 5   Number of Visits 9   Date for PT Re-Evaluation 03/22/15   Authorization Type UMR   PT Start Time 1022   PT Stop Time 1107   PT Time Calculation (min) 45 min      Past Medical History  Diagnosis Date  . GERD (gastroesophageal reflux disease)   . Arthritis     Past Surgical History  Procedure Laterality Date  . Knee surgery Left   . Myringotomy with tube placement Bilateral   . Knee arthroscopy Left 02/15/2015    Procedure: LEFT ARTHROSCOPY KNEE, CHONDROPLASTY, EXTENSIVE DEBRIDEMENT;  Surgeon: Carole Civil, MD;  Location: [facility];  Service: Orthopedics;  Laterality: Left;    There were no vitals filed for this visit.  Visit Diagnosis:  Stiffness of joint, lower leg, left  Knee pain, acute, left  Activity intolerance  Left leg weakness      Subjective Assessment - 03/15/15 1019    Subjective Pt states the MD drained his knee Monday so his leg feels much better. States he is still having difficulty with squatting to the ground and steps.   Currently in Pain? No/denies              [facility] Adult PT Treatment/Exercise - 03/15/15 1022    Knee/Hip Exercises: Stretches   Active Hamstring Stretch Left;3 reps;30 seconds   Knee: Self-Stretch to increase Flexion Left   Knee: Self-Stretch Limitations knee drives x on 69GE step   Knee/Hip Exercises: Aerobic   Nustep hills 3; level 4 x 10:00   Knee/Hip Exercises: Standing   Heel Raises Left;10 reps   Knee Flexion Strengthening;Left;15 reps   Knee Flexion Limitations 5#   Terminal Knee Extension Strengthening;Left;10 reps   Lateral Step  Up Left;10 reps;Step Height: 6"   Forward Step Up Left;10 reps;Step Height: 6"   Rocker Board 2 minutes   SLS x1 60 seconds    SLS with Vectors 3 x 15"   Manual Therapy   Manual Therapy Edema management   Edema Management ant and posteriorly to decrease edema                PT Education - 03/15/15 1112    Education provided Yes   Education Details to start any activity gradually even if it is not hurting him.  Pt walked 8 miles on the track which caused increased edema that had to be drawn out by MD   Person(s) Educated Patient   Methods Explanation   Comprehension Verbalized understanding          PT Short Term Goals - 03/01/15 1207    PT SHORT TERM GOAL #1   Title I HEP   Time 1   Period Days   Status New   PT SHORT TERM GOAL #2   Title ROM to 130 degree to allow pt to squat down in comfort    Time 10   Period Days   Status New   PT SHORT TERM GOAL #3   Title Pt to be able to sit for an hour in comfort to be able to go  out to eat and enjoy a meal without increased pain.    Time 10   Period Days   PT SHORT TERM GOAL #4   Title Pt to be able to stand for an hour to be working towards return to work activities.    Time 10   Period Days   Status New   PT SHORT TERM GOAL #5   Title Pain level to be no more than a 3/10 with dailly activities    Time 10   Period Days           PT Long Term Goals - 03/01/15 1210    PT LONG TERM GOAL #1   Title I in advance HEP   Time 3   Period Weeks   Status New   PT LONG TERM GOAL #2   Title Pt strength to be at least 4+/5 to allow pt to go into squatting postion and to return without use of UE    Time 3   Period Weeks   Status New   PT LONG TERM GOAL #3   Title Pt pain to be no greater than a 1/10 with daily activity to allow RTW   Time 3   Period Weeks   PT LONG TERM GOAL #4   Title Pt to be able to walk for an hour without discomfort for RTW   Time 3   Period Weeks   PT LONG TERM GOAL #5   Title Pt to be  able to sit with comfort for two hours to be able to travel and watch a movie in comfort.    Time 3   Period Weeks               Plan - 03/15/15 1113    Clinical Impression Statement Pt verbalizes no pain with exercises but has facial grimace.  Therapist neesds to be aware of form and facial expressions to ensure we are not taxing pt knee as we do not want to increase inflamation and cause increased edema.  Continued with manual after exercise session to promote decreased edema.  Pt able to advance to higher level balance activities.    Pt will benefit from skilled therapeutic intervention in order to improve on the following deficits Abnormal gait;Decreased activity tolerance;Decreased balance;Decreased endurance;Decreased range of motion;Decreased strength;Pain;Difficulty walking   PT Treatment/Interventions Gait training;Stair training;Functional mobility training;Therapeutic activities;Therapeutic exercise;Balance training;Patient/family education;Manual lymph drainage   PT Next Visit Plan Continue with current PT POC.  Progress strength and stability exericses.  Begin stairs.         Problem List Patient Active Problem List   Diagnosis Date Noted  . Chondromalacia of knee   . Synovitis of knee     Rayetta Humphrey, PT CLT [phone] 03/15/2015, 11:17 AM  [facility] [place], [place], [address] Phone: [phone]   Fax:  [phone]

## 2015-03-17 ENCOUNTER — Ambulatory Visit (HOSPITAL_COMMUNITY): Payer: 59 | Attending: Orthopedic Surgery | Admitting: Physical Therapy

## 2015-03-17 DIAGNOSIS — M25562 Pain in left knee: Secondary | ICD-10-CM | POA: Diagnosis present

## 2015-03-17 DIAGNOSIS — R6889 Other general symptoms and signs: Secondary | ICD-10-CM | POA: Diagnosis present

## 2015-03-17 DIAGNOSIS — M25662 Stiffness of left knee, not elsewhere classified: Secondary | ICD-10-CM | POA: Insufficient documentation

## 2015-03-17 DIAGNOSIS — R29898 Other symptoms and signs involving the musculoskeletal system: Secondary | ICD-10-CM | POA: Diagnosis present

## 2015-03-17 NOTE — Therapy (Signed)
[facility] [address] [place], [place], [address] Phone: [phone]   Fax:  [phone]  Physical Therapy Treatment  Patient Details  Name: Luke Nunez. MRN: [id number] Date of Birth: 1976-06-01 Referring Provider:  Carole Civil, MD  Encounter Date: 03/17/2015      PT End of Session - 03/17/15 1148    Visit Number 6   Number of Visits 9   Date for PT Re-Evaluation 03/22/15   Authorization Type UMR   PT Start Time 1104   PT Stop Time 1200   PT Time Calculation (min) 56 min   Activity Tolerance Patient tolerated treatment well   Behavior During Therapy [facility] for tasks assessed/performed      Past Medical History  Diagnosis Date  . GERD (gastroesophageal reflux disease)   . Arthritis     Past Surgical History  Procedure Laterality Date  . Knee surgery Left   . Myringotomy with tube placement Bilateral   . Knee arthroscopy Left 02/15/2015    Procedure: LEFT ARTHROSCOPY KNEE, CHONDROPLASTY, EXTENSIVE DEBRIDEMENT;  Surgeon: Carole Civil, MD;  Location: [facility];  Service: Orthopedics;  Laterality: Left;    There were no vitals filed for this visit.  Visit Diagnosis:  Stiffness of joint, lower leg, left  Knee pain, acute, left  Activity intolerance  Left leg weakness      Subjective Assessment - 03/17/15 1107    Subjective Pt states his knee feels better since getting it drained.  STates he has not been overdoing it. Currently without pain.   Currently in Pain? No/denies                         [facility] Adult PT Treatment/Exercise - 03/17/15 1114    Knee/Hip Exercises: Stretches   Active Hamstring Stretch Left;3 reps;30 seconds   Active Hamstring Stretch Limitations 12 in box   Knee: Self-Stretch to increase Flexion Left   Gastroc Stretch 3 reps;30 seconds   Gastroc Stretch Limitations slant board   Knee/Hip Exercises: Aerobic   Nustep hills 3; level 4 x 10:00  LE only   Knee/Hip  Exercises: Standing   Forward Lunges Both;10 reps;2 sets   Forward Lunges Limitations 4"   Lateral Step Up Left;10 reps   Lateral Step Up Limitations 4" no UE's    Forward Step Up Left;10 reps   Forward Step Up Limitations 4" no UE's   Wall Squat 10 reps;5 seconds   Stairs 4" no HHA 5RT reciprocally   SLS with Vectors 5X5" each   Manual Therapy   Manual Therapy Edema management   Manual therapy comments retro massage with elevation.   Edema Management ant and posteriorly to decrease edema                  PT Short Term Goals - 03/01/15 1207    PT SHORT TERM GOAL #1   Title I HEP   Time 1   Period Days   Status New   PT SHORT TERM GOAL #2   Title ROM to 130 degree to allow pt to squat down in comfort    Time 10   Period Days   Status New   PT SHORT TERM GOAL #3   Title Pt to be able to sit for an hour in comfort to be able to go out to eat and enjoy a meal without increased pain.    Time 10   Period  Days   PT SHORT TERM GOAL #4   Title Pt to be able to stand for an hour to be working towards return to work activities.    Time 10   Period Days   Status New   PT SHORT TERM GOAL #5   Title Pain level to be no more than a 3/10 with dailly activities    Time 10   Period Days           PT Long Term Goals - 03/01/15 1210    PT LONG TERM GOAL #1   Title I in advance HEP   Time 3   Period Weeks   Status New   PT LONG TERM GOAL #2   Title Pt strength to be at least 4+/5 to allow pt to go into squatting postion and to return without use of UE    Time 3   Period Weeks   Status New   PT LONG TERM GOAL #3   Title Pt pain to be no greater than a 1/10 with daily activity to allow RTW   Time 3   Period Weeks   PT LONG TERM GOAL #4   Title Pt to be able to walk for an hour without discomfort for RTW   Time 3   Period Weeks   PT LONG TERM GOAL #5   Title Pt to be able to sit with comfort for two hours to be able to travel and watch a movie in comfort.    Time  3   Period Weeks               Plan - 03/17/15 1208    Clinical Impression Statement Decreased step height to 4" with improved tolerance.  Also added reciprocal steps using 4" without diffiuculty or c/o pain.  Changed squat to wallslides to ensure correct form and equal weight bearing.  Pt with noted fatigue at end of session.  Conitnued with retro massage to decrease edema at end of session.  Minimal swelling but concentrated on medial inferior aspect of knee.     Pt will benefit from skilled therapeutic intervention in order to improve on the following deficits Abnormal gait;Decreased activity tolerance;Decreased balance;Decreased endurance;Decreased range of motion;Decreased strength;Pain;Difficulty walking   PT Next Visit Plan Continue with current PT POC.  Progress strength and stability exericses.  Progress to 7" side of stairwell.         Problem List Patient Active Problem List   Diagnosis Date Noted  . Chondromalacia of knee   . Synovitis of knee     Teena Irani, PTA/CLT [phone]  03/17/2015, 12:13 PM  [facility] [address] [place], [place], [address] Phone: [phone]   Fax:  [phone]

## 2015-03-22 ENCOUNTER — Ambulatory Visit (HOSPITAL_COMMUNITY): Payer: 59 | Admitting: Physical Therapy

## 2015-03-22 DIAGNOSIS — M25662 Stiffness of left knee, not elsewhere classified: Secondary | ICD-10-CM

## 2015-03-22 DIAGNOSIS — M25562 Pain in left knee: Secondary | ICD-10-CM

## 2015-03-22 DIAGNOSIS — R6889 Other general symptoms and signs: Secondary | ICD-10-CM

## 2015-03-22 DIAGNOSIS — R29898 Other symptoms and signs involving the musculoskeletal system: Secondary | ICD-10-CM

## 2015-03-22 NOTE — Therapy (Signed)
[facility] [address] [place], [place], [address] Phone: [phone]   Fax:  [phone]  Physical Therapy Treatment (Re-Assessment)  Patient Details  Name: Luke Nunez. MRN: [id number] Date of Birth: [birth year] Referring Provider:  Carole Civil, MD  Encounter Date: 03/22/2015      PT End of Session - 03/22/15 1342    Visit Number 7   Number of Visits 12   Date for PT Re-Evaluation 04/19/15   Authorization Type UMR   PT Start Time 1302   PT Stop Time 1346   PT Time Calculation (min) 44 min   Activity Tolerance Patient tolerated treatment well   Behavior During Therapy [facility] for tasks assessed/performed      Past Medical History  Diagnosis Date  . GERD (gastroesophageal reflux disease)   . Arthritis     Past Surgical History  Procedure Laterality Date  . Knee surgery Left   . Myringotomy with tube placement Bilateral   . Knee arthroscopy Left 02/15/2015    Procedure: LEFT ARTHROSCOPY KNEE, CHONDROPLASTY, EXTENSIVE DEBRIDEMENT;  Surgeon: Carole Civil, MD;  Location: [facility];  Service: Orthopedics;  Laterality: Left;    There were no vitals filed for this visit.  Visit Diagnosis:  Stiffness of joint, lower leg, left  Knee pain, acute, left  Activity intolerance  Left leg weakness      Subjective Assessment - 03/22/15 1304    Subjective Patient reports that he is not having any pain; he states that squats have gotten a lot better but reports that he has not really been able to get down on his hands and knees even on a soft surface. Still having a little bit of a sharp pain with squats.    How long can you sit comfortably? 9/7- 2 hours max    How long can you stand comfortably? 9/7- 2 hours    How long can you walk comfortably? 9/7- able to walk around 2-2.5 miles before needing to stop    Currently in Pain? No/denies            [facility] PT Assessment - 03/22/15 0001    Observation/Other Assessments    Focus on Therapeutic Outcomes (FOTO)  36% limited    Single Leg Stance   Comments 60 seconds each side    Sit to Stand   Comments 12 in 30 seconds    AROM   Left Knee Extension 0   Left Knee Flexion 125   Strength   Left Hip Flexion 5/5   Left Hip Extension 4+/5   Left Hip ABduction 5/5   Left Knee Flexion 4-/5   Left Knee Extension 4+/5   Left Ankle Dorsiflexion 5/5                     [facility] Adult PT Treatment/Exercise - 03/22/15 0001    Knee/Hip Exercises: Stretches   Active Hamstring Stretch Left;3 reps;30 seconds   Active Hamstring Stretch Limitations 14 inch box    Piriformis Stretch Both;2 reps;30 seconds   Gastroc Stretch 3 reps;30 seconds   Gastroc Stretch Limitations slant board   Knee/Hip Exercises: Aerobic   Nustep hills 3; level 4 x 10:00  LE only                PT Education - 03/22/15 1341    Education provided Yes   Education Details progress with skilled PT services, plan of care moving forward; proper progression for increasing  ankle weights at home    Person(s) Educated Patient   Methods Explanation   Comprehension Verbalized understanding          PT Short Term Goals - 03/22/15 1329    PT SHORT TERM GOAL #1   Title I HEP   Baseline 9/7- doing HEP every day   Time 1   Period Days   Status Achieved   PT SHORT TERM GOAL #2   Baseline 9/7- 125 degrees    Time 10   Period Days   Status On-going   PT SHORT TERM GOAL #3   Title Pt to be able to sit for an hour in comfort to be able to go out to eat and enjoy a meal without increased pain.    Time 10   Status Achieved   PT SHORT TERM GOAL #4   Title Pt to be able to stand for an hour to be working towards return to work activities.    Time 10   Period Days   Status Achieved   PT SHORT TERM GOAL #5   Title Pain level to be no more than a 3/10 with dailly activities    Baseline 9/7- tends to feel more tired than painful    Time 10   Period Days   Status Achieved            PT Long Term Goals - 03/22/15 1331    PT LONG TERM GOAL #1   Title I in advance HEP   Time 3   Period Weeks   Status On-going   PT LONG TERM GOAL #2   Title Pt strength to be at least 4+/5 to allow pt to go into squatting postion and to return without use of UE    Time 3   Period Weeks   Status On-going   PT LONG TERM GOAL #3   Title Pt pain to be no greater than a 1/10 with daily activity to allow RTW   Time 3   Period Weeks   Status Achieved   PT LONG TERM GOAL #4   Title Pt to be able to walk for an hour without discomfort for RTW   Baseline 9/7- 40-45 minutes   Time 3   Period Weeks   Status On-going   PT LONG TERM GOAL #5   Title Pt to be able to sit with comfort for two hours to be able to travel and watch a movie in comfort.    Time 3   Period Weeks   Status Achieved               Plan - 03/22/15 1342    Clinical Impression Statement Re-assessment performed today. Patient demonstrates increases in strength, ROM, and overall functional activity tolerance and functional task performance. Patient also reports that his remaining major concern is squatting at work as well as being able to crawl on hands and knees at work. At this time patient will benefit from an extension of skilled PT services, for 5 more sessions, in order to focus on functional strength and identify strategies that will assist him in being able to successfully perform work related tasks.    Pt will benefit from skilled therapeutic intervention in order to improve on the following deficits Abnormal gait;Decreased activity tolerance;Decreased balance;Decreased endurance;Decreased range of motion;Decreased strength;Pain;Difficulty walking   Rehab Potential Good   PT Frequency 3x / week   PT Duration 3 weeks   PT Treatment/Interventions Gait training;Stair training;Functional  mobility training;Therapeutic activities;Therapeutic exercise;Balance training;Patient/family education;Manual lymph  drainage   PT Next Visit Plan Continue with current PT POC.  Progress strength and stability exericses.  Progress to 7" side of stairwell.    PT Home Exercise Plan given   Consulted and Agree with Plan of Care Patient        Problem List Patient Active Problem List   Diagnosis Date Noted  . Chondromalacia of knee   . Synovitis of knee    Physical Therapy Progress Note  Dates of Reporting Period: 03/01/15  to 03/22/15  Objective Reports of Subjective Statement: patient reports his greatest concerns about returning to work are squatting and needing to crawl on his hands and knees   Objective Measurements: see above   Goal Update: see above   Plan: see above   Reason Skilled Services are Required: continue to focus on functional strength, work on Scientist, clinical (histocompatibility and immunogenetics) for return to work, develop appropriate HEP    Deniece Ree PT, DPT [facility] [place], [place], [address] Phone: [phone]   Fax:  [phone]

## 2015-03-27 ENCOUNTER — Ambulatory Visit (HOSPITAL_COMMUNITY): Payer: 59 | Admitting: Physical Therapy

## 2015-03-27 DIAGNOSIS — R29898 Other symptoms and signs involving the musculoskeletal system: Secondary | ICD-10-CM

## 2015-03-27 DIAGNOSIS — M25662 Stiffness of left knee, not elsewhere classified: Secondary | ICD-10-CM

## 2015-03-27 DIAGNOSIS — R6889 Other general symptoms and signs: Secondary | ICD-10-CM

## 2015-03-27 DIAGNOSIS — M25562 Pain in left knee: Secondary | ICD-10-CM

## 2015-03-27 NOTE — Therapy (Signed)
[facility] [address] [place], [place], [address] Phone: [phone]   Fax:  [phone]  Physical Therapy Treatment  Patient Details  Name: Luke Nunez. MRN: [id number] Date of Birth: February 21, 1976 Referring Provider:  Carole Civil, MD  Encounter Date: 03/27/2015      PT End of Session - 03/27/15 1031    Visit Number 8   Number of Visits 12   Date for PT Re-Evaluation 04/19/15   Authorization Type U[employer]   PT Start Time 0932   PT Stop Time 1015   PT Time Calculation (min) 43 min   Activity Tolerance Patient tolerated treatment well   Behavior During Therapy [facility] for tasks assessed/performed      Past Medical History  Diagnosis Date  . GERD (gastroesophageal reflux disease)   . Arthritis     Past Surgical History  Procedure Laterality Date  . Knee surgery Left   . Myringotomy with tube placement Bilateral   . Knee arthroscopy Left 02/15/2015    Procedure: LEFT ARTHROSCOPY KNEE, CHONDROPLASTY, EXTENSIVE DEBRIDEMENT;  Surgeon: Carole Civil, MD;  Location: [facility];  Service: Orthopedics;  Laterality: Left;    There were no vitals filed for this visit.  Visit Diagnosis:  Stiffness of joint, lower leg, left  Knee pain, acute, left  Activity intolerance  Left leg weakness      Subjective Assessment - 03/27/15 0938    Subjective Pt reports that his main limitation is squatting and being able to crawl on his hands and knees for work.    Currently in Pain? No/denies   Pain Score 0-No pain                         [facility] Adult PT Treatment/Exercise - 03/27/15 0001    Knee/Hip Exercises: Stretches   Active Hamstring Stretch Left;3 reps;30 seconds   Active Hamstring Stretch Limitations 14 inch box    Piriformis Stretch Both;2 reps;30 seconds   Gastroc Stretch 3 reps;30 seconds   Gastroc Stretch Limitations slant board   Knee/Hip Exercises: Machines for Strengthening   Other Machine power tower  single leg squats at 18 x 10   Knee/Hip Exercises: Standing   Forward Lunges Both;10 reps;2 sets   Lateral Step Up Left;15 reps;Step Height: 6"   Forward Step Up Left;15 reps;Step Height: 6"   Forward Step Up Limitations no UE   Wall Squat 10 reps   Other Standing Knee Exercises sidestepping with GTB x 2 RT   Knee/Hip Exercises: Seated   Stool Scoot - Round Trips 2   Sit to Sand 10 reps;without UE support  RLE on 4" box                PT Education - 03/27/15 1031    Education provided Yes   Education Details PT suggested trying volleyball knee pad when kneeling to decrease knee pain   Person(s) Educated Patient   Methods Explanation   Comprehension Verbalized understanding          PT Short Term Goals - 03/22/15 1329    PT SHORT TERM GOAL #1   Title I HEP   Baseline 9/7- doing HEP every day   Time 1   Period Days   Status Achieved   PT SHORT TERM GOAL #2   Baseline 9/7- 125 degrees    Time 10   Period Days   Status On-going   PT SHORT TERM GOAL #3  Title Pt to be able to sit for an hour in comfort to be able to go out to eat and enjoy a meal without increased pain.    Time 10   Status Achieved   PT SHORT TERM GOAL #4   Title Pt to be able to stand for an hour to be working towards return to work activities.    Time 10   Period Days   Status Achieved   PT SHORT TERM GOAL #5   Title Pain level to be no more than a 3/10 with dailly activities    Baseline 9/7- tends to feel more tired than painful    Time 10   Period Days   Status Achieved           PT Long Term Goals - 03/22/15 1331    PT LONG TERM GOAL #1   Title I in advance HEP   Time 3   Period Weeks   Status On-going   PT LONG TERM GOAL #2   Title Pt strength to be at least 4+/5 to allow pt to go into squatting postion and to return without use of UE    Time 3   Period Weeks   Status On-going   PT LONG TERM GOAL #3   Title Pt pain to be no greater than a 1/10 with daily activity to  allow RTW   Time 3   Period Weeks   Status Achieved   PT LONG TERM GOAL #4   Title Pt to be able to walk for an hour without discomfort for RTW   Baseline 9/7- 40-45 minutes   Time 3   Period Weeks   Status On-going   PT LONG TERM GOAL #5   Title Pt to be able to sit with comfort for two hours to be able to travel and watch a movie in comfort.    Time 3   Period Weeks   Status Achieved               Plan - 03/27/15 1031    Clinical Impression Statement Treatment session focused on improving quad and glut strength to improve his ability to complete squatting activities for work. Pt responded well to addition of STS with LLE bias, single leg squats on power tower, and stool scoots without any reports of increased pain. Pt required verbal and visual cueing during wall squats to maintain equal weightbearing bilaterally.    PT Next Visit Plan Continue with squatting activites with LLE bias, progress stair training to 7" steps        Problem List Patient Active Problem List   Diagnosis Date Noted  . Chondromalacia of knee   . Synovitis of knee     Hilma Favors, PT, DPT [phone] 03/27/2015, 10:35 AM  [facility] [place], [place], [address] Phone: [phone]   Fax:  [phone]

## 2015-03-28 ENCOUNTER — Encounter: Payer: Self-pay | Admitting: Orthopedic Surgery

## 2015-03-28 ENCOUNTER — Ambulatory Visit (INDEPENDENT_AMBULATORY_CARE_PROVIDER_SITE_OTHER): Payer: 59 | Admitting: Orthopedic Surgery

## 2015-03-28 VITALS — BP 129/92 | Ht 75.0 in | Wt 294.0 lb

## 2015-03-28 DIAGNOSIS — Z9889 Other specified postprocedural states: Secondary | ICD-10-CM

## 2015-03-28 DIAGNOSIS — Z4789 Encounter for other orthopedic aftercare: Secondary | ICD-10-CM

## 2015-03-28 NOTE — Progress Notes (Signed)
Patient ID: Luke East., male   DOB: Apr 21, 1976, 39 y.o.   MRN: [id number]  Follow up visit  Chief Complaint  Patient presents with  . Follow-up    2 week follow up left knee s/p asp/inj, SALK, DOS 02/15/15    BP 129/92 mmHg  Ht 6\' 3"  (1.905 m)  Wt 294 lb (133.358 kg)  BMI 36.75 kg/m2  Encounter Diagnoses  Name Primary?  . S/P left knee arthroscopy Yes  . Aftercare following surgery of the musculoskeletal system     Surgical findings are listed below. After injection and aspiration patient says that he also curled his activities to include almost no walking. He did have some difficulty squatting in therapy.  But now he is asymptomatic not taking any medicines he's regaining 125 of knee flexion and minimal discomfort  Recommend activity modification  Follow-up as needed  Return to work next week.   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

## 2015-03-28 NOTE — Patient Instructions (Signed)
Return to work 04/05/15

## 2015-03-29 ENCOUNTER — Ambulatory Visit (HOSPITAL_COMMUNITY): Payer: 59

## 2015-03-31 ENCOUNTER — Telehealth (HOSPITAL_COMMUNITY): Payer: Self-pay | Admitting: Physical Therapy

## 2015-03-31 ENCOUNTER — Ambulatory Visit (HOSPITAL_COMMUNITY): Payer: 59 | Admitting: Physical Therapy

## 2015-03-31 NOTE — Telephone Encounter (Signed)
Patient thought that b/c Dr. Aline Brochure released him that we would not see him anymore. He will call his insurance co and let us know if he wants to continue. 03/31/15 NF.

## 2015-04-03 ENCOUNTER — Ambulatory Visit (HOSPITAL_COMMUNITY): Payer: 59 | Admitting: Physical Therapy

## 2015-04-05 ENCOUNTER — Ambulatory Visit (HOSPITAL_COMMUNITY): Payer: 59 | Admitting: Physical Therapy

## 2015-04-06 ENCOUNTER — Telehealth (HOSPITAL_COMMUNITY): Payer: Self-pay | Admitting: Physical Therapy

## 2015-04-06 NOTE — Telephone Encounter (Signed)
L/M ask patient to call us back to let us know if he wants to continue his PT he has 4 apptments on my schedule

## 2015-04-07 ENCOUNTER — Ambulatory Visit (HOSPITAL_COMMUNITY): Payer: 59 | Admitting: Physical Therapy

## 2015-04-10 ENCOUNTER — Ambulatory Visit (HOSPITAL_COMMUNITY): Payer: 59 | Admitting: Physical Therapy

## 2015-04-10 NOTE — Therapy (Addendum)
[facility] [address] [place], [place], [address] Phone: [phone]   Fax:  [phone]  Patient Details  Name: Luke Nunez. MRN: [id number] Date of Birth: 22-Dec-1975 Referring Provider:  Carole Civil, MD  Encounter Date: 04/10/2015  PHYSICAL THERAPY DISCHARGE SUMMARY  Visits from Start of Care: 8  Current functional level related to goals / functional outcomes: Difficulty with squatting and crawling on hands and knees; however MD has cleared patient to continue work and is being discharged from skilled PT services at this time.    Remaining deficits: Difficulty with squatting, crawling    Education / Equipment: N/A  Plan: Patient agrees to discharge.  Patient goals were partially met. Patient is being discharged due to being pleased with the current functional level.  ?????       Deniece Ree PT, DPT [facility] [address] [place], [place], [address] Phone: [phone]   Fax:  [phone]

## 2015-04-12 ENCOUNTER — Encounter (HOSPITAL_COMMUNITY): Payer: 59 | Admitting: Physical Therapy

## 2015-04-14 ENCOUNTER — Encounter (HOSPITAL_COMMUNITY): Payer: 59 | Admitting: Physical Therapy

## 2015-04-14 ENCOUNTER — Telehealth: Payer: Self-pay | Admitting: Orthopedic Surgery

## 2015-04-14 NOTE — Telephone Encounter (Signed)
Patient is c/o Lft Knee Swelling/Cant Bend well, taking Ibuprofren and icing, no pain, just swelling and cant bend, please advise?

## 2015-04-17 NOTE — Telephone Encounter (Signed)
Needs aspiration of the knee as soon as we can get him in

## 2015-04-18 NOTE — Telephone Encounter (Signed)
I tried to reach patient, I left message on his answering machine

## 2015-04-20 NOTE — Telephone Encounter (Signed)
Patient is aware of the recommendation 

## 2015-05-24 NOTE — Telephone Encounter (Signed)
Called concerning apt date and time  Vianey Caniglia, LPTA; CBIS [phone]  

## 2016-04-24 IMAGING — MR MR KNEE*L* W/O CM
4 of 6 series · 12 of 40 positions shown · non-contrast
Comparison: Radiographs 11/21/2014.  MRI 07/29/2014.

CLINICAL DATA: Posteromedial knee pain with swelling following
injury 1 month ago. Remote knee surgery (lateral release per
patient).

EXAM:
MRI OF THE LEFT KNEE WITHOUT CONTRAST
TECHNIQUE: Multiplanar, multisequence MR imaging of the knee was performed. No
intravenous contrast was administered.

[Series 5: pdfs axial · axial · 3.0mm · 0.26mm/px · z∈[-72,+18]mm · 3 of 35 slices shown]
[im 5/35]
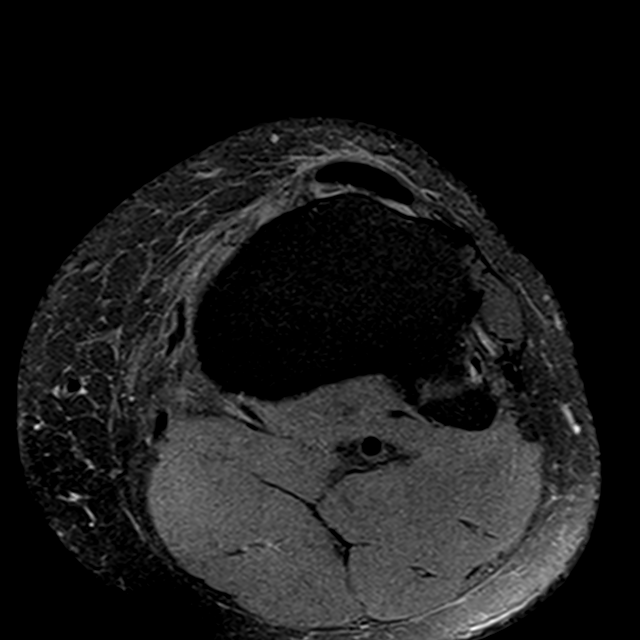
[im 20/35]
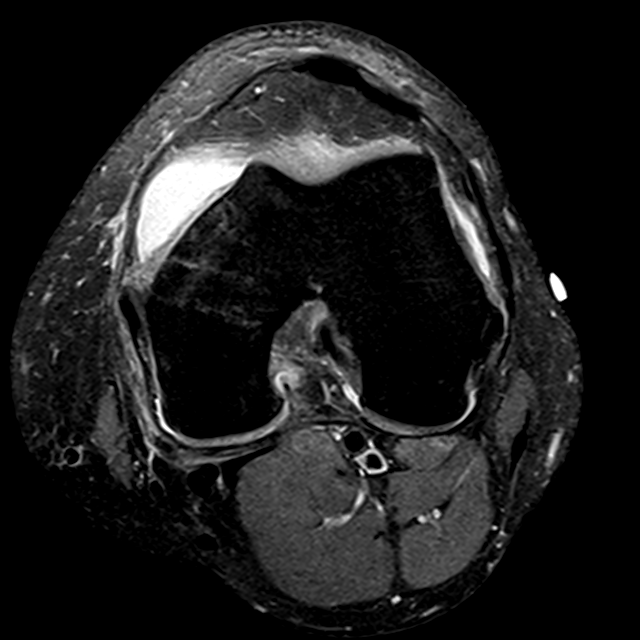
[im 30/35]
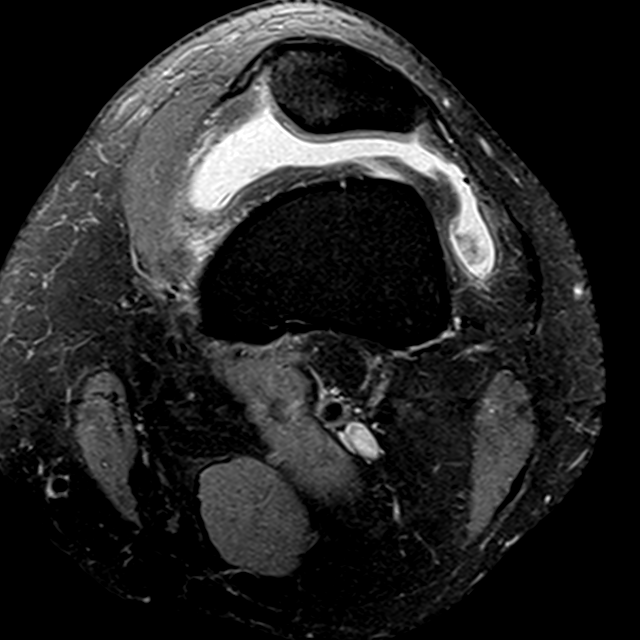

[Series 6: T1 · coronal · 3.0mm · 0.26mm/px · 3 of 30 slices shown]
[im 5/30]
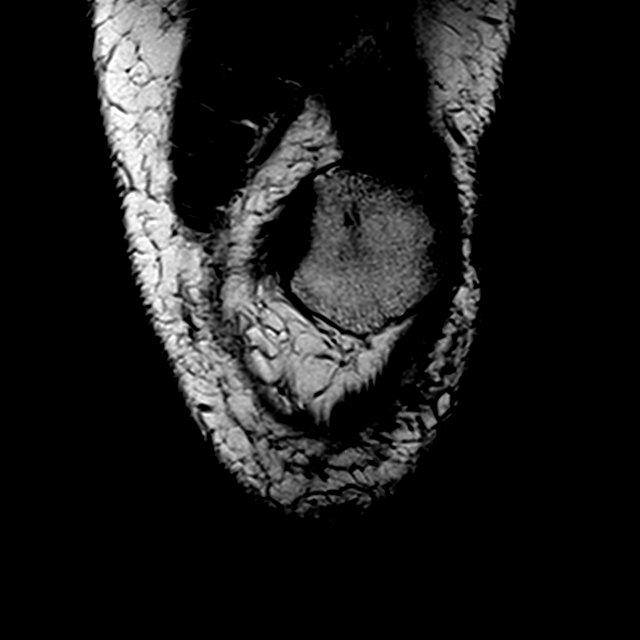
[im 15/30]
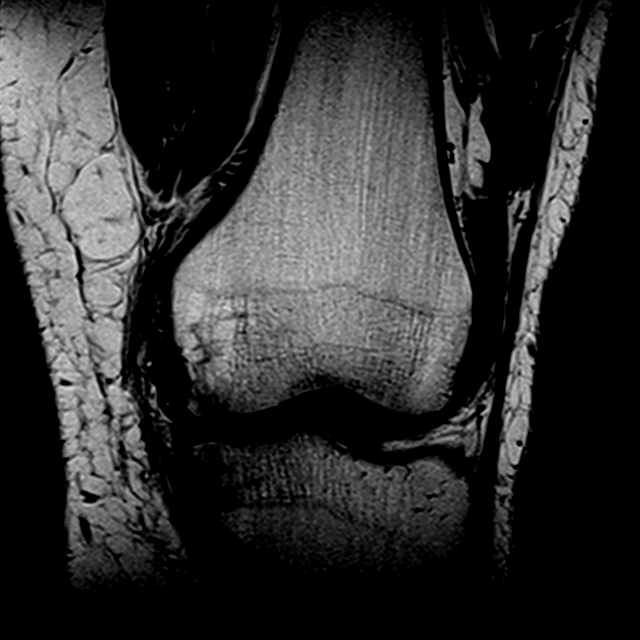
[im 25/30]
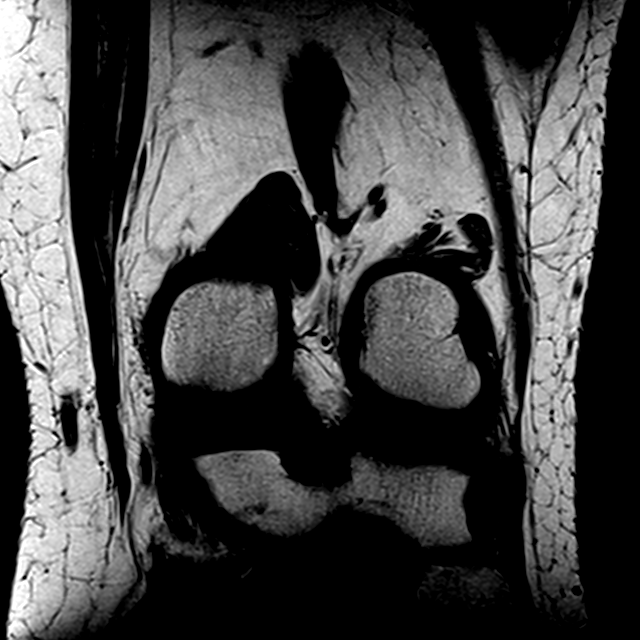

[Series 7: pdfs sag · sagittal · 3.0mm · 0.24mm/px · 3 of 31 slices shown]
[im 6/31]
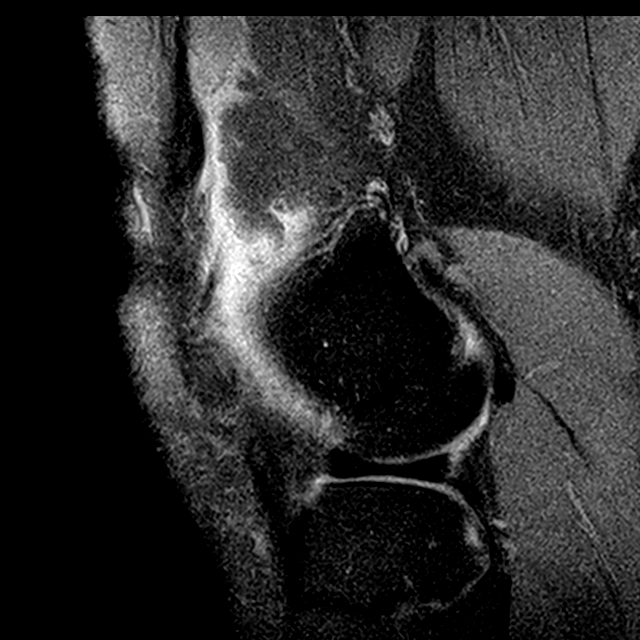
[im 16/31]
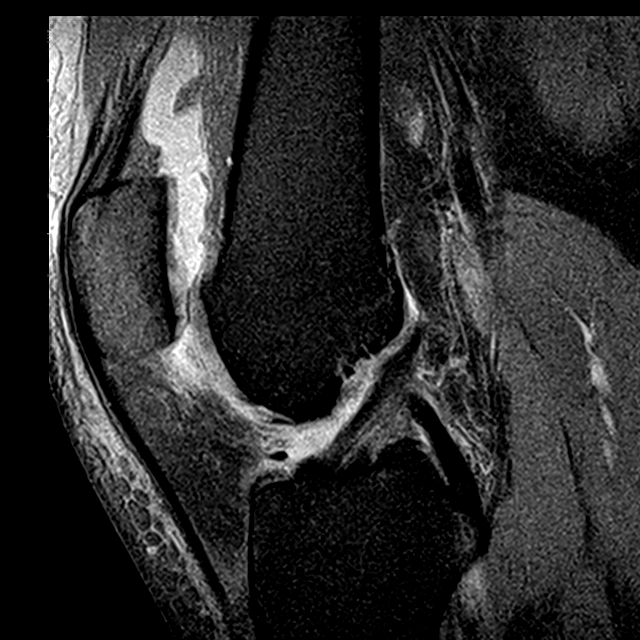
[im 26/31]
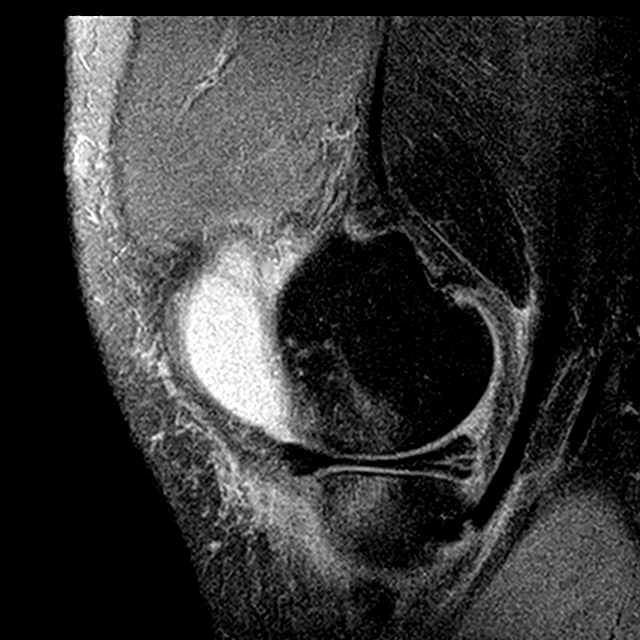

[Series 8: t2fs cor · coronal · 3.0mm · 0.26mm/px · 3 of 30 slices shown]
[im 5/30]
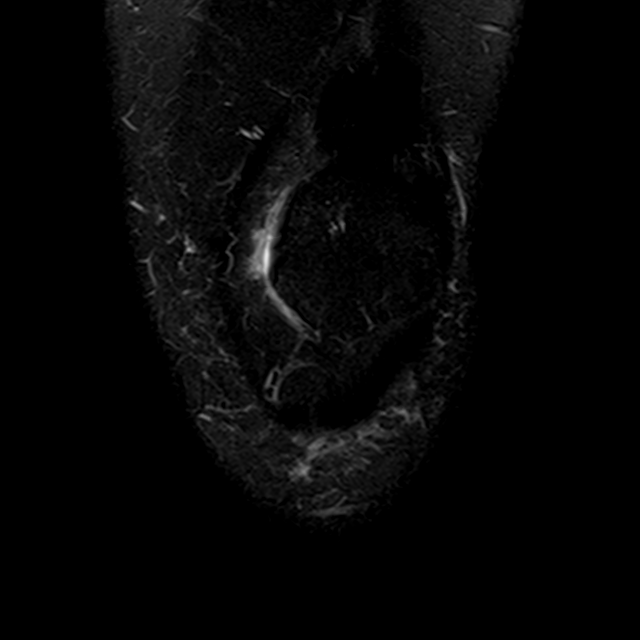
[im 15/30]
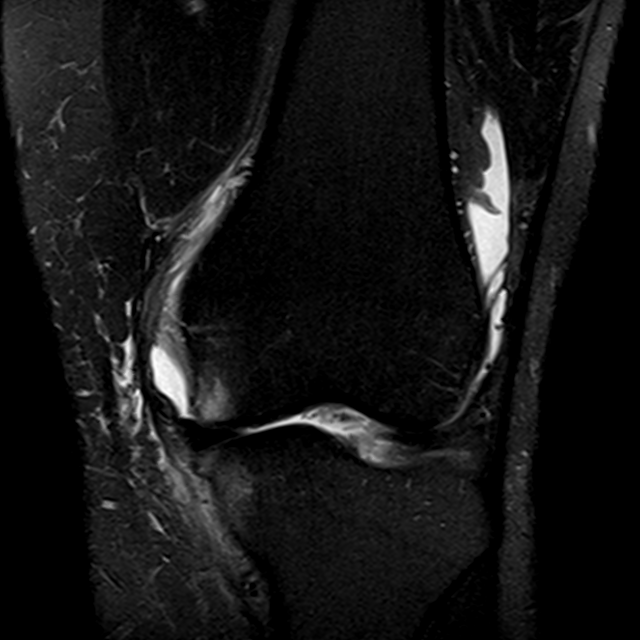
[im 25/30]
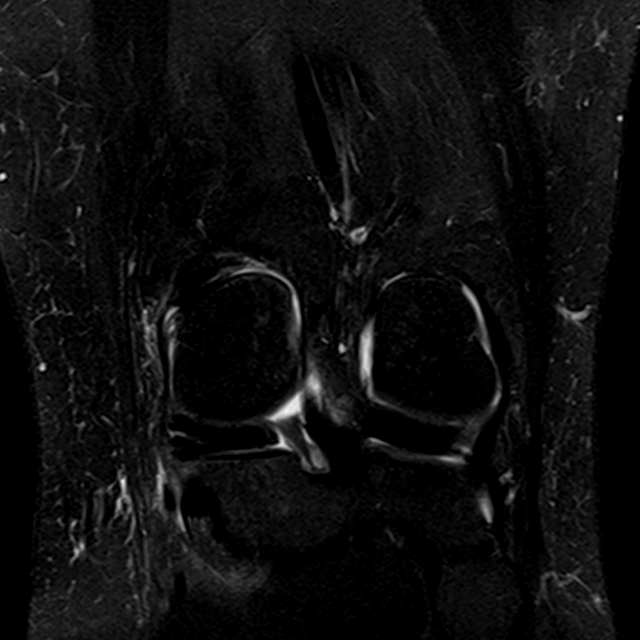

[12 of 40 positions shown; findings below may reference images not displayed]

FINDINGS: MENISCI

Medial meniscus: There is a new large radial tear of the posterior
horn at the meniscal root with resulting meniscal extrusion from the
joint. There is increased intrasubstance signal throughout the
posterior horn including a horizontal linear component peripherally.
This does not show any definite extension to an articular surface.
No parameniscal cyst or centrally displaced meniscal fragment.

Lateral meniscus:  Intact with normal morphology.

LIGAMENTS

Cruciates:  Intact.

Collaterals: Intact. There is mild MCL degeneration with medial
bursal fluid.

CARTILAGE

Patellofemoral: Fissuring and surface irregularity of the cartilage
over the superior aspect of the patellar apex appears slightly
progressive. There is mild subchondral cyst formation. The trochlear
cartilage appears intact.

Medial: Progressive chondral thinning and surface irregularity with
subchondral edema peripherally in the medial femoral condyle and
tibial plateau.

Lateral:  Preserved.

OTHER

Joint:  Moderate-sized joint effusion.  No loose bodies observed.

Popliteal Fossa:  Unremarkable. No significant Baker's cyst.

Extensor Mechanism: Intact. There are stable postsurgical changes
related to remote lateral retinacula release. There is increased
soft tissue edema along the inferior aspect of the medial patellar
retinaculum and MCL.

Bones:  No significant extra-articular osseous findings.
IMPRESSION: 1. New large radial tear involving the root of the posterior horn of
the medial meniscus. There is resulting meniscal extrusion from the
joint. There is increased signal throughout the peripheral aspect of
the posterior horn without definite extension to an articular
surface.
2. Progressive medial compartment degenerative chondrosis with new
marrow edema peripherally.
3. Mildly progressive superior patellar chondromalacia.
4. MCL degeneration with medial bursal fluid. This may contribute to
medial pain.
